# Patient Record
Sex: Female | Born: 1995
Health system: Southern US, Community
[De-identification: ages and names within clinical notes are randomized; demographics above are authoritative.]

## PROBLEM LIST (undated history)

## (undated) DIAGNOSIS — L709 Acne, unspecified: Secondary | ICD-10-CM

## (undated) DIAGNOSIS — M21069 Valgus deformity, not elsewhere classified, unspecified knee: Secondary | ICD-10-CM

## (undated) HISTORY — PX: WISDOM TOOTH EXTRACTION: SHX21

## (undated) HISTORY — PX: TYMPANOSTOMY TUBE PLACEMENT: SHX32

## (undated) HISTORY — DX: Acne, unspecified: L70.9

## (undated) HISTORY — DX: Valgus deformity, not elsewhere classified, unspecified knee: M21.069

---
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
## 2014-07-02 ENCOUNTER — Ambulatory Visit: Payer: BC Managed Care – PPO | Admitting: Family Medicine

## 2015-01-01 ENCOUNTER — Ambulatory Visit (INDEPENDENT_AMBULATORY_CARE_PROVIDER_SITE_OTHER): Payer: Federal, State, Local not specified - PPO | Admitting: Family Medicine

## 2015-01-01 ENCOUNTER — Encounter: Payer: Self-pay | Admitting: Family Medicine

## 2015-01-01 VITALS — BP 100/58 | HR 85 | Temp 98.1°F | Ht 66.5 in | Wt 125.4 lb

## 2015-01-01 DIAGNOSIS — L700 Acne vulgaris: Secondary | ICD-10-CM | POA: Diagnosis not present

## 2015-01-01 DIAGNOSIS — Z7189 Other specified counseling: Secondary | ICD-10-CM | POA: Diagnosis not present

## 2015-01-01 DIAGNOSIS — Z7689 Persons encountering health services in other specified circumstances: Secondary | ICD-10-CM

## 2015-01-01 DIAGNOSIS — L709 Acne, unspecified: Secondary | ICD-10-CM

## 2015-01-01 NOTE — Patient Instructions (Signed)
Physical with pap in 12/2014  We recommend the following healthy lifestyle measures: - eat a healthy diet consisting of lots of vegetables, fruits, beans, nuts, seeds, healthy meats such as white chicken and fish and whole grains.  - avoid fried foods, fast food, processed foods, sodas, red meet and other fattening foods.  - get a least 150 minutes of aerobic exercise per week.

## 2015-01-01 NOTE — Progress Notes (Signed)
HPI:  Brittany Shepard is here to establish care.  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 Se[facility] her [facility] - on acutane and just had labs done which she brought today. Very mild trig elevation. On OCPs as well. Doing well. Uses abstinence. She sees Arlana LindauJulie Fisher at [facility].   ROS negative for unless reported above: fevers, unintentional weight loss, hearing or vision loss, chest pain, palpitations, struggling to breath, hemoptysis, melena, hematochezia, hematuria, falls, loc, si, thoughts of self harm  Past Medical History  Diagnosis Date  . Acne   . GENU VALGUM 05/26/2010    Qualifier: Diagnosis of  By: Jennette KettleNeal MD, Huntley DecSara      Past Surgical History  Procedure Laterality Date  . Wisdom tooth extraction    . Tympanostomy tube placement      Family History  Problem Relation Age of Onset  . Arthritis Maternal Grandmother   . Diabetes Paternal Grandfather   . Lung cancer Paternal Grandfather   . Kidney cancer Maternal Grandmother   . Hypertension Paternal Grandfather   . Hypertension Paternal Grandmother     History   Social History  . Marital Status: Single    Spouse Name: N/A  . Number of Children: N/A  . Years of Education: N/A   Social History Main Topics  . Smoking status: Never Smoker   . Smokeless tobacco: Not on file  . Alcohol Use: Not on file     Comment: occ alcohol, a few drinks once to twice per month  . Drug Use: No  . Sexual Activity: No   Other Topics Concern  . None   Social History Narrative   Work or School: [employer], nanny during the summer      Home Situation: living at home during the summer, roommates during the school year      Spiritual Beliefs: Christian      Lifestyle: regular exercise, runner - about 5 miles per week, works out too; healthy diet           Current outpatient prescriptions:  .  ISOtretinoin (ACCUTANE) 40 MG capsule, Take 80 mg by mouth daily., Disp: , Rfl:   .  norethindrone-ethinyl estradiol 1/35 (NORTREL 1/35, 21,) tablet, Take 1 tablet by mouth daily., Disp: , Rfl:   EXAM:  Filed Vitals:   01/01/15 1116  BP: 100/58  Pulse: 85  Temp: 98.1 F (36.7 C)    Body mass index is 19.94 kg/(m^2).  GENERAL: vitals reviewed and listed above, alert, oriented, appears well hydrated and in no acute distress  HEENT: atraumatic, conjunttiva clear, no obvious abnormalities on inspection of external nose and ears  NECK: no obvious masses on inspection  LUNGS: clear to auscultation bilaterally, no wheezes, rales or rhonchi, good air movement  CV: HRRR, no peripheral edema  MS: moves all extremities without noticeable abnormality  PSYCH: pleasant and cooperative, no obvious depression or anxiety  ASSESSMENT AND PLAN:  Discussed the following assessment and plan:  Encounter to establish care  Acne, unspecified acne type  Acne vulgaris  -We reviewed the PMH, PSH, FH, SH, Meds and Allergies. -We provided refills for any medications we will prescribe as needed. -We addressed current concerns per orders and patient instructions. -We have asked for records for pertinent exams, studies, vaccines and notes from previous providers. -We have advised patient to follow up per instructions below.   -Patient advised to return or notify a doctor immediately  if symptoms worsen or persist or new concerns arise.  Patient Instructions  Physical with pap in 12/2014  We recommend the following healthy lifestyle measures: - eat a healthy diet consisting of lots of vegetables, fruits, beans, nuts, seeds, healthy meats such as white chicken and fish and whole grains.  - avoid fried foods, fast food, processed foods, sodas, red meet and other fattening foods.  - get a least 150 minutes of aerobic exercise per week.       Kriste Basque R.

## 2015-01-01 NOTE — Progress Notes (Signed)
Pre visit review using our clinic review tool, if applicable. No additional management support is needed unless otherwise documented below in the visit note. 

## 2016-01-07 ENCOUNTER — Encounter: Payer: Self-pay | Admitting: Family Medicine

## 2016-01-07 ENCOUNTER — Ambulatory Visit (INDEPENDENT_AMBULATORY_CARE_PROVIDER_SITE_OTHER): Payer: Federal, State, Local not specified - PPO | Admitting: Family Medicine

## 2016-01-07 ENCOUNTER — Other Ambulatory Visit (HOSPITAL_COMMUNITY)
Admission: RE | Admit: 2016-01-07 | Discharge: 2016-01-07 | Disposition: A | Discharge status: Home / Self Care | Payer: Federal, State, Local not specified - PPO | Source: Ambulatory Visit | Attending: Family Medicine | Admitting: Family Medicine

## 2016-01-07 VITALS — BP 100/58 | HR 95 | Temp 99.0°F | Ht 67.25 in | Wt 134.6 lb

## 2016-01-07 DIAGNOSIS — Z0000 Encounter for general adult medical examination without abnormal findings: Secondary | ICD-10-CM

## 2016-01-07 DIAGNOSIS — Z01419 Encounter for gynecological examination (general) (routine) without abnormal findings: Secondary | ICD-10-CM | POA: Insufficient documentation

## 2016-01-07 DIAGNOSIS — Z124 Encounter for screening for malignant neoplasm of cervix: Secondary | ICD-10-CM

## 2016-01-07 NOTE — Addendum Note (Signed)
Addended by: Johnella MoloneyFUNDERBURK, JO A on: 01/07/2016 09:12 AM   Modules accepted: Orders

## 2016-01-07 NOTE — Patient Instructions (Signed)
BEFORE YOU LEAVE: -follow up: physical in 1 year  We have ordered a pap smear at this visit. It can take up to 1-2 weeks for results and processing. IF results require follow up or explanation, we will call you with instructions. Clinically stable results will be released to your [facility]. If you have not heard from us or cannot find your results in [facility] in 2 weeks please contact our office at [phone].  If you are not yet signed up for [facility], please consider signing up.  We recommend the following healthy lifestyle: 1) Eat a healthy clean diet with avoidance of (less then 1 serving per week) processed foods, sweetened drinks, white starches, red meat, fast foods and sweets and consisting of: * 5-9 servings per day of fresh or frozen fruits and vegetables (not corn or potatoes, not dried or canned) *nuts and seeds, beans *olives and olive oil *small portions of lean meats such as fish and white chicken  *small portions of whole grains -Get at least 150 minutes of sweaty aerobic exercise per week -reduce stress - counseling, meditation, relaxation to balance other aspects of your life

## 2016-01-07 NOTE — Progress Notes (Signed)
Pre visit review using our clinic review tool, if applicable. No additional management support is needed unless otherwise documented below in the visit note. 

## 2016-01-07 NOTE — Progress Notes (Signed)
HPI:  Here for CPE:  -Concerns and/or follow up today: none. Spent a month in [place] this summer. 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  -Diet: variety of foods, balance and well rounded, l  -Exercise: regular exercise  -Taking folic acid, vitamin D or calcium: no  -Diabetes and Dyslipidemia Screening: n/a  -Hx of HTN: no  -Vaccines: UTD  -pap history: never  -FDLMP: 12/09/15  -sexual activity: in the past  -wants STI testing (Hep C if born [birth year]): no  -FH breast, colon or ovarian ca: see FH Last mammogram: n/a Last colon cancer screening: n/a  Breast Ca Risk Assessment:  -Alcohol, Tobacco, drug use: see social history  Review of Systems - no fevers, unintentional weight loss, vision loss, hearing loss, chest pain, sob, hemoptysis, melena, hematochezia, hematuria, genital discharge, changing or concerning skin lesions, bleeding, bruising, loc, thoughts of self harm or SI  Past Medical History  Diagnosis Date  . Acne   . GENU VALGUM 05/26/2010    Qualifier: Diagnosis of  By: Jennette Kettle MD, Huntley Dec      Past Surgical History  Procedure Laterality Date  . Wisdom tooth extraction    . Tympanostomy tube placement      Family History  Problem Relation Age of Onset  . Arthritis Maternal Grandmother   . Diabetes Paternal Grandfather   . Lung cancer Paternal Grandfather   . Kidney cancer Maternal Grandmother   . Hypertension Paternal Grandfather   . Hypertension Paternal Grandmother     Social History   Social History  . Marital Status: Single    Spouse Name: N/A  . Number of Children: N/A  . Years of Education: N/A   Social History Main Topics  . Smoking status: Never Smoker   . Smokeless tobacco: None  . Alcohol Use: None     Comment: occ alcohol, a few drinks once to twice per month  . Drug Use: No  . Sexual Activity: No   Other Topics Concern  . 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  norethindrone-ethinyl estradiol 1/35 (NORTREL 1/35, 21,) tablet, Take 1 tablet by mouth daily., Disp: , Rfl:   EXAM:  Filed Vitals:   01/07/16 0825  BP: 100/58  Pulse: 95  Temp: 99 F (37.2 C)    GENERAL: vitals reviewed and listed below, alert, oriented, appears well hydrated and in no acute distress  HEENT: head atraumatic, PERRLA, normal appearance of eyes, ears, nose and mouth. moist mucus membranes.  NECK: supple, no masses or lymphadenopathy  LUNGS: clear to auscultation bilaterally, no rales, rhonchi or wheeze  CV: HRRR, no peripheral edema or cyanosis, normal pedal pulses  BREAST: normal appearance - no lesions or discharge, on palpation normal breast tissue without any suspicious masses  ABDOMEN: bowel sounds normal, soft, non tender to palpation, no masses, no rebound or guarding  GU: normal appearance of external genitalia - no lesions or masses, normal vaginal mucosa - no abnormal discharge, normal appearance of cervix - no lesions or abnormal discharge, no masses or tenderness on palpation of uterus  and ovaries.  RECTAL: refused  SKIN: no rash or abnormal lesions  MS: normal gait, moves all extremities normally  NEURO: normal gait, speech and thought processing grossly intact, muscle tone grossly intact throughout  PSYCH: normal affect, pleasant and cooperative  ASSESSMENT AND PLAN:  Discussed the following assessment and plan:  Visit for preventive health examination  Cervical cancer screening  -Discussed and advised all [place] preventive services health task force level A and B recommendations for age, sex and risks.  -Advised at least 150 minutes of exercise per week and a  healthy diet with avoidance of (less then 1 serving per week) processed foods, white starches, red meat, fast foods and sweets and consisting of: * 5-9 servings of fresh fruits and vegetables (not corn or potatoes) *nuts and seeds, beans *olives and olive oil *lean meats such as fish and white chicken  *whole grains  -labs, studies and vaccines per orders this encounter  No orders of the defined types were placed in this encounter.    Patient advised to return to clinic immediately if symptoms worsen or persist or new concerns.  Patient Instructions  BEFORE YOU LEAVE: -follow up: physical in 1 year  We have ordered a pap smear at this visit. It can take up to 1-2 weeks for results and processing. IF results require follow up or explanation, we will call you with instructions. Clinically stable results will be released to your [facility]. If you have not heard from us or cannot find your results in [facility] in 2 weeks please contact our office at [phone].  If you are not yet signed up for [facility], please consider signing up.  We recommend the following healthy lifestyle: 1) Eat a healthy clean diet with avoidance of (less then 1 serving per week) processed foods, sweetened drinks, white starches, red meat, fast foods and sweets and consisting of: * 5-9 servings per day of fresh or frozen fruits and vegetables (not corn or potatoes, not dried or canned) *nuts and seeds, beans *olives and olive oil *small portions of lean meats such as fish and white chicken  *small portions of whole grains -Get at least 150 minutes of sweaty aerobic exercise per week -reduce stress - counseling, meditation, relaxation to balance other aspects of your life             No Follow-up on file.  Kriste BasqueKIM, HANNAH R., DO

## 2016-01-08 LAB — CYTOLOGY - PAP

## 2016-01-17 ENCOUNTER — Ambulatory Visit (INDEPENDENT_AMBULATORY_CARE_PROVIDER_SITE_OTHER): Payer: Federal, State, Local not specified - PPO | Admitting: Family Medicine

## 2016-01-17 ENCOUNTER — Other Ambulatory Visit (HOSPITAL_COMMUNITY)
Admission: RE | Admit: 2016-01-17 | Discharge: 2016-01-17 | Disposition: A | Discharge status: Home / Self Care | Payer: Federal, State, Local not specified - PPO | Source: Ambulatory Visit | Attending: Family Medicine | Admitting: Family Medicine

## 2016-01-17 ENCOUNTER — Encounter: Payer: Self-pay | Admitting: Family Medicine

## 2016-01-17 VITALS — BP 120/68 | HR 105 | Temp 98.9°F | Ht 67.25 in | Wt 131.7 lb

## 2016-01-17 DIAGNOSIS — N898 Other specified noninflammatory disorders of vagina: Secondary | ICD-10-CM | POA: Diagnosis not present

## 2016-01-17 DIAGNOSIS — Z113 Encounter for screening for infections with a predominantly sexual mode of transmission: Secondary | ICD-10-CM | POA: Insufficient documentation

## 2016-01-17 DIAGNOSIS — N760 Acute vaginitis: Secondary | ICD-10-CM | POA: Diagnosis not present

## 2016-01-17 NOTE — Progress Notes (Signed)
Pre visit review using our clinic review tool, if applicable. No additional management support is needed unless otherwise documented below in the visit note. 

## 2016-01-17 NOTE — Patient Instructions (Signed)
Make sure we have a good number to reach you on.

## 2016-01-17 NOTE — Progress Notes (Signed)
HPI:  Brittany Shepard is an anxious and pleasant 20 yo here for vaginitis. Has had one sexual encounter once and did not use protection. Partner is a very good friend and is not known to have STI or high risk behaviors. However, she has had a slight vaginal discharge and had some vulvovag pruritis 4 days ago. Denies fevers, NV, pelvic or abdominal pain, foul odor, dysuria or any other symptoms. Just had period and was on time and normal. Declined upreg. Just had normal pap.   ROS: See pertinent positives and negatives per HPI.  Past Medical History  Diagnosis Date  . Acne   . GENU VALGUM 05/26/2010    Qualifier: Diagnosis of  By: Jennette KettleNeal MD, Huntley DecSara      Past Surgical History  Procedure Laterality Date  . Wisdom tooth extraction    . Tympanostomy tube placement      Family History  Problem Relation Age of Onset  . Arthritis Maternal Grandmother   . Diabetes Paternal Grandfather   . Lung cancer Paternal Grandfather   . Kidney cancer Maternal Grandmother   . Hypertension Paternal Grandfather   . Hypertension Paternal Grandmother     Social History   Social History  . Marital Status: Single    Spouse Name: N/A  . Number of Children: N/A  . Years of Education: N/A   Social History Main Topics  . Smoking status: Never Smoker   . Smokeless tobacco: None  . Alcohol Use: None     Comment: occ alcohol, a few drinks once to twice per month  . Drug Use: No  . Sexual Activity: No   Other Topics Concern  . 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  loratadine (CLARITIN) 10 MG tablet, Take 10 mg by mouth daily., Disp: , Rfl:  .  norethindrone-ethinyl  estradiol 1/35 (NORTREL 1/35, 21,) tablet, Take 1 tablet by mouth daily., Disp: , Rfl:   EXAM:  Filed Vitals:   01/17/16 0939  BP: 120/68  Pulse: 105  Temp: 98.9 F (37.2 C)    Body mass index is 20.48 kg/(m^2).  GENERAL: vitals reviewed and listed above, alert, oriented, appears well hydrated and in no acute distress  HEENT: atraumatic, conjunttiva clear, no obvious abnormalities on inspection of external nose and ears  NECK: no obvious masses on inspection  GU: normal inspection external genitalia, white homogenous vaginal discharge, no CMT  MS: moves all extremities without noticeable abnormality  PSYCH: pleasant and cooperative, no obvious depression or anxiety  ASSESSMENT AND PLAN:  Discussed the following assessment and plan:  Vaginal discharge - Plan: Cervicovaginal ancillary only  -GC/Chlam/Trich/BV/yeast testing pending -advised to use condoms for all encounters -offer tx today for GC/Chlam out of abundance of caution given age and unprotected sex, but suspect BV - she opted to hold off after discussion risks/benefits and treat only if testing positive -Patient advised to return or notify a doctor immediately if symptoms worsen or persist or new concerns arise.  Patient Instructions  Make sure we have a good number to reach you on.  Colin Benton R., DO

## 2016-01-20 LAB — CERVICOVAGINAL ANCILLARY ONLY
Chlamydia: NEGATIVE
NEISSERIA GONORRHEA: NEGATIVE
Trichomonas: NEGATIVE

## 2016-01-21 ENCOUNTER — Encounter: Payer: Self-pay | Admitting: Family Medicine

## 2016-01-22 LAB — CERVICOVAGINAL ANCILLARY ONLY
Bacterial vaginitis: POSITIVE — AB
Candida vaginitis: NEGATIVE

## 2016-01-23 MED ORDER — METRONIDAZOLE 500 MG PO TABS: 500.0000 mg | ORAL_TABLET | Freq: Two times a day (BID) | ORAL | 0 refills | Status: DC

## 2016-01-23 NOTE — Addendum Note (Signed)
Addended by: Johnella Moloney on: 01/23/2016 07:32 AM   Modules accepted: Orders

## 2016-01-28 ENCOUNTER — Encounter: Payer: Self-pay | Admitting: Family Medicine

## 2016-04-03 ENCOUNTER — Telehealth: Payer: Self-pay | Admitting: Family Medicine

## 2016-04-03 MED ORDER — NORETHINDRONE-ETH ESTRADIOL 0.5-35 MG-MCG PO TABS: 1.0000 | ORAL_TABLET | Freq: Every day | ORAL | 11 refills | Status: DC

## 2016-04-03 NOTE — Telephone Encounter (Signed)
Patients mother is asking that we refill norethindrone-ethinyl estradiol 1/35 (NORTREL 1/35, 21,) tablet  [facility] [address]. We did the patients pap, and the patient has not been back to her obgyn since that time. She is asking that we change the RX to .5/38/28 rather than the script on file. She states that the patient goes off of it for 3 days only and it has made a positive difference.

## 2016-04-03 NOTE — Addendum Note (Signed)
Addended by: Johnella MoloneyFUNDERBURK, JO A on: 04/03/2016 02:02 PM   Modules accepted: Orders

## 2016-04-03 NOTE — Telephone Encounter (Signed)
I called the pt and she stated she has not missed any pills at all.  The correct Rx for Notrel was sent to the pts pharmacy and she is aware of this.

## 2016-04-03 NOTE — Telephone Encounter (Signed)
Brittany CollumJo Anne, Please call pt (not mother). Please ensure daily use without missing pills. If missed doses, advise upreg, then can refill current ocp for 1 year. If not missed doses ok to refill.Thanks.

## 2016-10-23 DIAGNOSIS — Z20828 Contact with and (suspected) exposure to other viral communicable diseases: Secondary | ICD-10-CM | POA: Diagnosis not present

## 2016-11-08 DIAGNOSIS — Z20828 Contact with and (suspected) exposure to other viral communicable diseases: Secondary | ICD-10-CM | POA: Diagnosis not present

## 2016-11-08 DIAGNOSIS — J029 Acute pharyngitis, unspecified: Secondary | ICD-10-CM | POA: Diagnosis not present

## 2016-12-15 ENCOUNTER — Other Ambulatory Visit: Payer: Self-pay | Admitting: Family Medicine

## 2016-12-15 NOTE — Telephone Encounter (Signed)
Patient is scheduled to see you for a CPE on 01/22/17 at 11.30, Pt is requesting to have a refill on NORTREL 0.5 MG. Please Advise.

## 2017-01-08 ENCOUNTER — Encounter: Payer: Federal, State, Local not specified - PPO | Admitting: Family Medicine

## 2017-01-21 NOTE — Progress Notes (Signed)
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 health nut  -Exercise:  regular exercise  -Taking folic acid, vitamin D or calcium: taking vit D  -Diabetes and Dyslipidemia Screening: n/a  -Hx of HTN: no  -Vaccines: UTD  -pap history: normal 12/2015  -FDLMP: monthly, regular, normal  -sexual activity: no partners, wants to wait for the right person, continues OCPs  -wants STI testing (Hep C if born 45-65): agrees to offered HIV screening, declines others  -FH breast, colon or ovarian ca: see FH Last mammogram: n/a Last colon cancer screening: n/a  -Alcohol, Tobacco, drug use: see social history  Review of Systems - no fevers, unintentional weight loss, vision loss, hearing loss, chest pain, sob, hemoptysis, melena, hematochezia, hematuria, genital discharge, changing or concerning skin lesions, bleeding, bruising, loc, thoughts of self harm or SI  Past Medical History:  Diagnosis Date  . Acne   . GENU VALGUM 05/26/2010   Qualifier: Diagnosis of  By: Nori Riis MD, Clarise Cruz      Past Surgical History:  Procedure Laterality Date  . TYMPANOSTOMY TUBE PLACEMENT    . WISDOM TOOTH EXTRACTION      Family History  Problem Relation Age of Onset  . Arthritis Maternal Grandmother   . Diabetes Paternal Grandfather   . Lung cancer Paternal Grandfather   . Kidney cancer Maternal Grandmother   . Hypertension Paternal Grandfather   . Hypertension Paternal Grandmother     Social History   Social History  . Marital status: Single    Spouse name: N/A  . Number of children: N/A  . Years of education: N/A   Social History Main Topics  . Smoking status: Never Smoker  . Smokeless tobacco: Never Used  . Alcohol use None     Comment: occ alcohol, a few drinks once to twice per month  . Drug use: No  . Sexual activity: No   Other Topics Concern  . None   Social History Narrative   Work or School:  [employer], nanny during the summer      Home Situation: living at home during the summer, roommates during the school year      Spiritual Beliefs: Christian      Lifestyle: regular exercise, runner - about 5 miles per week, works out too; healthy diet           Current Outpatient Prescriptions:  .  loratadine (CLARITIN) 10 MG tablet, Take 10 mg by mouth daily., Disp: , Rfl:  .  NORTREL 0.5/35, 28, 0.5-35 MG-MCG tablet, TAKE 1 TABLET BY MOUTH DAILY., Disp: 28 tablet, Rfl: 3  EXAM:  Vitals:   01/22/17 1123  BP: 108/70  Pulse: 88  Temp: 99 F (37.2 C)    GENERAL: vitals reviewed and listed below, alert, oriented, appears well hydrated and in no acute distress  HEENT: head atraumatic, PERRLA, normal appearance of eyes, ears, nose and mouth. moist mucus membranes.  NECK: supple, no masses or lymphadenopathy  LUNGS: clear to auscultation bilaterally, no rales, rhonchi or wheeze  CV: HRRR, no peripheral edema or cyanosis, normal pedal pulses  ABDOMEN: bowel sounds normal, soft, non tender to palpation, no masses, no rebound or guarding  GU/BREAST: declined  SKIN: no rash or abnormal lesions  MS: normal gait, moves all extremities normally  NEURO: normal gait, speech and thought processing grossly intact, muscle tone grossly intact throughout  PSYCH: normal affect,  pleasant and cooperative  ASSESSMENT AND PLAN:  Discussed the following assessment and plan:  Encounter for preventive health examination - Plan: HIV antibody  Encounter for surveillance of contraceptive pills  -Discussed and advised all [place] preventive services health task force level A and B recommendations for age, sex and risks.  -Advised at least 150 minutes of exercise per week and a healthy diet with avoidance of (less then 1 serving per week) processed foods, white starches, red meat, fast foods and sweets and consisting of: * 5-9 servings of fresh fruits and vegetables (not corn or  potatoes) *nuts and seeds, beans *olives and olive oil *lean meats such as fish and white chicken  *whole grains  -labs, studies and vaccines per orders this encounter  Orders Placed This Encounter  Procedures  . HIV antibody    Patient advised to return to clinic immediately if symptoms worsen or persist or new concerns.  Patient Instructions  BEFORE YOU LEAVE: -lab -follow up: yearly for CPE  We have ordered labs or studies at this visit. It can take up to 1-2 weeks for results and processing. IF results require follow up or explanation, we will call you with instructions. Clinically stable results will be released to your [facility]. If you have not heard from [place] or cannot find your results in [facility] in 2 weeks please contact our office at [phone].  If you are not yet signed up for [facility], please consider signing up.  Health Maintenance, Female Adopting a healthy lifestyle and getting preventive care can go a long way to promote health and wellness. Talk with your health care provider about what schedule of regular examinations is right for you. This is a good chance for you to check in with your provider about disease prevention and staying healthy. In between checkups, there are plenty of things you can do on your own. Experts have done a lot of research about which lifestyle changes and preventive measures are most likely to keep you healthy. Ask your health care provider for more information. Weight and diet Eat a healthy diet  Be sure to include plenty of vegetables, fruits, low-fat dairy products, and lean protein.  Do not eat a lot of foods high in solid fats, added sugars, or salt.  Get regular exercise. This is one of the most important things you can do for your health. ? Most adults should exercise for at least 150 minutes each week. The exercise should increase your heart rate and make you sweat (moderate-intensity exercise). ? Most adults should also do  strengthening exercises at least twice a week. This is in addition to the moderate-intensity exercise.  Maintain a healthy weight  Body mass index (BMI) is a measurement that can be used to identify possible weight problems. It estimates body fat based on height and weight. Your health care provider can help determine your BMI and help you achieve or maintain a healthy weight.  For females 9 years of age and older: ? A BMI below 18.5 is considered underweight. ? A BMI of 18.5 to 24.9 is normal. ? A BMI of 25 to 29.9 is considered overweight. ? A BMI of 30 and above is considered obese.  Watch levels of cholesterol and blood lipids  You should start having your blood tested for lipids and cholesterol at 22 years of age, then have this test every 5 years.  You may need to have your cholesterol levels checked more often if: ? Your lipid or cholesterol levels  are high. ? You are older than 22 years of age. ? You are at high risk for heart disease.  Cancer screening Lung Cancer  Lung cancer screening is recommended for adults 57-55 years old who are at high risk for lung cancer because of a history of smoking.  A yearly low-dose CT scan of the lungs is recommended for people who: ? Currently smoke. ? Have quit within the past 15 years. ? Have at least a 30-pack-year history of smoking. A pack year is smoking an average of one pack of cigarettes a day for 1 year.  Yearly screening should continue until it has been 15 years since you quit.  Yearly screening should stop if you develop a health problem that would prevent you from having lung cancer treatment.  Breast Cancer  Practice breast self-awareness. This means understanding how your breasts normally appear and feel.  It also means doing regular breast self-exams. Let your health care provider know about any changes, no matter how small.  If you are in your 20s or 30s, you should have a clinical breast exam (CBE) by a health  care provider every 1-3 years as part of a regular health exam.  If you are 40 or older, have a CBE every year. Also consider having a breast X-ray (mammogram) every year.  If you have a family history of breast cancer, talk to your health care provider about genetic screening.  If you are at high risk for breast cancer, talk to your health care provider about having an MRI and a mammogram every year.  Breast cancer gene (BRCA) assessment is recommended for women who have family members with BRCA-related cancers. BRCA-related cancers include: ? Breast. ? Ovarian. ? Tubal. ? Peritoneal cancers.  Results of the assessment will determine the need for genetic counseling and BRCA1 and BRCA2 testing.  Cervical Cancer Your health care provider may recommend that you be screened regularly for cancer of the pelvic organs (ovaries, uterus, and vagina). This screening involves a pelvic examination, including checking for microscopic changes to the surface of your cervix (Pap test). You may be encouraged to have this screening done every 3 years, beginning at age 22.  For women ages 67-65, health care providers may recommend pelvic exams and Pap testing every 3 years, or they may recommend the Pap and pelvic exam, combined with testing for human papilloma virus (HPV), every 5 years. Some types of HPV increase your risk of cervical cancer. Testing for HPV may also be done on women of any age with unclear Pap test results.  Other health care providers may not recommend any screening for nonpregnant women who are considered low risk for pelvic cancer and who do not have symptoms. Ask your health care provider if a screening pelvic exam is right for you.  If you have had past treatment for cervical cancer or a condition that could lead to cancer, you need Pap tests and screening for cancer for at least 20 years after your treatment. If Pap tests have been discontinued, your risk factors (such as having a new  sexual partner) need to be reassessed to determine if screening should resume. Some women have medical problems that increase the chance of getting cervical cancer. In these cases, your health care provider may recommend more frequent screening and Pap tests.  Colorectal Cancer  This type of cancer can be detected and often prevented.  Routine colorectal cancer screening usually begins at 22 years of age and continues through  22 years of age.  Your health care provider may recommend screening at an earlier age if you have risk factors for colon cancer.  Your health care provider may also recommend using home test kits to check for hidden blood in the stool.  A small camera at the end of a tube can be used to examine your colon directly (sigmoidoscopy or colonoscopy). This is done to check for the earliest forms of colorectal cancer.  Routine screening usually begins at age 33.  Direct examination of the colon should be repeated every 5-10 years through 22 years of age. However, you may need to be screened more often if early forms of precancerous polyps or small growths are found.  Skin Cancer  Check your skin from head to toe regularly.  Tell your health care provider about any new moles or changes in moles, especially if there is a change in a mole's shape or color.  Also tell your health care provider if you have a mole that is larger than the size of a pencil eraser.  Always use sunscreen. Apply sunscreen liberally and repeatedly throughout the day.  Protect yourself by wearing long sleeves, pants, a wide-brimmed hat, and sunglasses whenever you are outside.  Heart disease, diabetes, and high blood pressure  High blood pressure causes heart disease and increases the risk of stroke. High blood pressure is more likely to develop in: ? People who have blood pressure in the high end of the normal range (130-139/85-89 mm Hg). ? People who are overweight or obese. ? People who are  African American.  If you are 100-3 years of age, have your blood pressure checked every 3-5 years. If you are 60 years of age or older, have your blood pressure checked every year. You should have your blood pressure measured twice-once when you are at a hospital or clinic, and once when you are not at a hospital or clinic. Record the average of the two measurements. To check your blood pressure when you are not at a hospital or clinic, you can use: ? An automated blood pressure machine at a pharmacy. ? A home blood pressure monitor.  If you are between 92 years and 69 years old, ask your health care provider if you should take aspirin to prevent strokes.  Have regular diabetes screenings. This involves taking a blood sample to check your fasting blood sugar level. ? If you are at a normal weight and have a low risk for diabetes, have this test once every three years after 22 years of age. ? If you are overweight and have a high risk for diabetes, consider being tested at a younger age or more often. Preventing infection Hepatitis B  If you have a higher risk for hepatitis B, you should be screened for this virus. You are considered at high risk for hepatitis B if: ? You were born in a country where hepatitis B is common. Ask your health care provider which countries are considered high risk. ? Your parents were born in a high-risk country, and you have not been immunized against hepatitis B (hepatitis B vaccine). ? You have HIV or AIDS. ? You use needles to inject street drugs. ? You live with someone who has hepatitis B. ? You have had sex with someone who has hepatitis B. ? You get hemodialysis treatment. ? You take certain medicines for conditions, including cancer, organ transplantation, and autoimmune conditions.  Hepatitis C  Blood testing is recommended for: ? Everyone  born from [birth year] through 1966. ? Anyone with known risk factors for hepatitis C.  Sexually transmitted  infections (STIs)  You should be screened for sexually transmitted infections (STIs) including gonorrhea and chlamydia if: ? You are sexually active and are younger than 22 years of age. ? You are older than 22 years of age and your health care provider tells you that you are at risk for this type of infection. ? Your sexual activity has changed since you were last screened and you are at an increased risk for chlamydia or gonorrhea. Ask your health care provider if you are at risk.  If you do not have HIV, but are at risk, it may be recommended that you take a prescription medicine daily to prevent HIV infection. This is called pre-exposure prophylaxis (PrEP). You are considered at risk if: ? You are sexually active and do not regularly use condoms or know the HIV status of your partner(s). ? You take drugs by injection. ? You are sexually active with a partner who has HIV.  Talk with your health care provider about whether you are at high risk of being infected with HIV. If you choose to begin PrEP, you should first be tested for HIV. You should then be tested every 3 months for as long as you are taking PrEP. Pregnancy  If you are premenopausal and you may become pregnant, ask your health care provider about preconception counseling.  If you may become pregnant, take 400 to 800 micrograms (mcg) of folic acid every day.  If you want to prevent pregnancy, talk to your health care provider about birth control (contraception). Osteoporosis and menopause  Osteoporosis is a disease in which the bones lose minerals and strength with aging. This can result in serious bone fractures. Your risk for osteoporosis can be identified using a bone density scan.  If you are 89 years of age or older, or if you are at risk for osteoporosis and fractures, ask your health care provider if you should be screened.  Ask your health care provider whether you should take a calcium or vitamin D supplement to lower  your risk for osteoporosis.  Menopause may have certain physical symptoms and risks.  Hormone replacement therapy may reduce some of these symptoms and risks. Talk to your health care provider about whether hormone replacement therapy is right for you. Follow these instructions at home:  Schedule regular health, dental, and eye exams.  Stay current with your immunizations.  Do not use any tobacco products including cigarettes, chewing tobacco, or electronic cigarettes.  If you are pregnant, do not drink alcohol.  If you are breastfeeding, limit how much and how often you drink alcohol.  Limit alcohol intake to no more than 1 drink per day for nonpregnant women. One drink equals 12 ounces of beer, 5 ounces of wine, or 1 ounces of hard liquor.  Do not use street drugs.  Do not share needles.  Ask your health care provider for help if you need support or information about quitting drugs.  Tell your health care provider if you often feel depressed.  Tell your health care provider if you have ever been abused or do not feel safe at home. This information is not intended to replace advice given to you by your health care provider. Make sure you discuss any questions you have with your health care provider. Document Released: 12/29/2010 Document Revised: 11/21/2015 Document Reviewed: 03/19/2015 Elsevier Interactive Patient Education  [employer].  No Follow-up on file.  Colin Benton R., DO

## 2017-01-22 ENCOUNTER — Encounter: Payer: Self-pay | Admitting: Family Medicine

## 2017-01-22 ENCOUNTER — Ambulatory Visit (INDEPENDENT_AMBULATORY_CARE_PROVIDER_SITE_OTHER): Payer: Federal, State, Local not specified - PPO | Admitting: Family Medicine

## 2017-01-22 VITALS — BP 108/70 | HR 88 | Temp 99.0°F | Ht 67.0 in | Wt 128.4 lb

## 2017-01-22 DIAGNOSIS — Z3041 Encounter for surveillance of contraceptive pills: Secondary | ICD-10-CM

## 2017-01-22 DIAGNOSIS — Z0000 Encounter for general adult medical examination without abnormal findings: Secondary | ICD-10-CM | POA: Diagnosis not present

## 2017-01-22 NOTE — Patient Instructions (Signed)
BEFORE YOU LEAVE: -lab -follow up: yearly for CPE  We have ordered labs or studies at this visit. It can take up to 1-2 weeks for results and processing. IF results require follow up or explanation, we will call you with instructions. Clinically stable results will be released to your [facility]. If you have not heard from [place] or cannot find your results in [facility] in 2 weeks please contact our office at [phone].  If you are not yet signed up for [facility], please consider signing up.  Health Maintenance, Female Adopting a healthy lifestyle and getting preventive care can go a long way to promote health and wellness. Talk with your health care provider about what schedule of regular examinations is right for you. This is a good chance for you to check in with your provider about disease prevention and staying healthy. In between checkups, there are plenty of things you can do on your own. Experts have done a lot of research about which lifestyle changes and preventive measures are most likely to keep you healthy. Ask your health care provider for more information. Weight and diet Eat a healthy diet  Be sure to include plenty of vegetables, fruits, low-fat dairy products, and lean protein.  Do not eat a lot of foods high in solid fats, added sugars, or salt.  Get regular exercise. This is one of the most important things you can do for your health. ? Most adults should exercise for at least 150 minutes each week. The exercise should increase your heart rate and make you sweat (moderate-intensity exercise). ? Most adults should also do strengthening exercises at least twice a week. This is in addition to the moderate-intensity exercise.  Maintain a healthy weight  Body mass index (BMI) is a measurement that can be used to identify possible weight problems. It estimates body fat based on height and weight. Your health care provider can help determine your BMI and help you achieve or maintain a  healthy weight.  For females 36 years of age and older: ? A BMI below 18.5 is considered underweight. ? A BMI of 18.5 to 24.9 is normal. ? A BMI of 25 to 29.9 is considered overweight. ? A BMI of 30 and above is considered obese.  Watch levels of cholesterol and blood lipids  You should start having your blood tested for lipids and cholesterol at 22 years of age, then have this test every 5 years.  You may need to have your cholesterol levels checked more often if: ? Your lipid or cholesterol levels are high. ? You are older than 22 years of age. ? You are at high risk for heart disease.  Cancer screening Lung Cancer  Lung cancer screening is recommended for adults 61-65 years old who are at high risk for lung cancer because of a history of smoking.  A yearly low-dose CT scan of the lungs is recommended for people who: ? Currently smoke. ? Have quit within the past 15 years. ? Have at least a 30-pack-year history of smoking. A pack year is smoking an average of one pack of cigarettes a day for 1 year.  Yearly screening should continue until it has been 15 years since you quit.  Yearly screening should stop if you develop a health problem that would prevent you from having lung cancer treatment.  Breast Cancer  Practice breast self-awareness. This means understanding how your breasts normally appear and feel.  It also means doing regular breast self-exams. Let your  health care provider know about any changes, no matter how small.  If you are in your 20s or 30s, you should have a clinical breast exam (CBE) by a health care provider every 1-3 years as part of a regular health exam.  If you are 62 or older, have a CBE every year. Also consider having a breast X-ray (mammogram) every year.  If you have a family history of breast cancer, talk to your health care provider about genetic screening.  If you are at high risk for breast cancer, talk to your health care provider about  having an MRI and a mammogram every year.  Breast cancer gene (BRCA) assessment is recommended for women who have family members with BRCA-related cancers. BRCA-related cancers include: ? Breast. ? Ovarian. ? Tubal. ? Peritoneal cancers.  Results of the assessment will determine the need for genetic counseling and BRCA1 and BRCA2 testing.  Cervical Cancer Your health care provider may recommend that you be screened regularly for cancer of the pelvic organs (ovaries, uterus, and vagina). This screening involves a pelvic examination, including checking for microscopic changes to the surface of your cervix (Pap test). You may be encouraged to have this screening done every 3 years, beginning at age 48.  For women ages 57-65, health care providers may recommend pelvic exams and Pap testing every 3 years, or they may recommend the Pap and pelvic exam, combined with testing for human papilloma virus (HPV), every 5 years. Some types of HPV increase your risk of cervical cancer. Testing for HPV may also be done on women of any age with unclear Pap test results.  Other health care providers may not recommend any screening for nonpregnant women who are considered low risk for pelvic cancer and who do not have symptoms. Ask your health care provider if a screening pelvic exam is right for you.  If you have had past treatment for cervical cancer or a condition that could lead to cancer, you need Pap tests and screening for cancer for at least 20 years after your treatment. If Pap tests have been discontinued, your risk factors (such as having a new sexual partner) need to be reassessed to determine if screening should resume. Some women have medical problems that increase the chance of getting cervical cancer. In these cases, your health care provider may recommend more frequent screening and Pap tests.  Colorectal Cancer  This type of cancer can be detected and often prevented.  Routine colorectal cancer  screening usually begins at 22 years of age and continues through 22 years of age.  Your health care provider may recommend screening at an earlier age if you have risk factors for colon cancer.  Your health care provider may also recommend using home test kits to check for hidden blood in the stool.  A small camera at the end of a tube can be used to examine your colon directly (sigmoidoscopy or colonoscopy). This is done to check for the earliest forms of colorectal cancer.  Routine screening usually begins at age 42.  Direct examination of the colon should be repeated every 5-10 years through 22 years of age. However, you may need to be screened more often if early forms of precancerous polyps or small growths are found.  Skin Cancer  Check your skin from head to toe regularly.  Tell your health care provider about any new moles or changes in moles, especially if there is a change in a mole's shape or color.  Also  tell your health care provider if you have a mole that is larger than the size of a pencil eraser.  Always use sunscreen. Apply sunscreen liberally and repeatedly throughout the day.  Protect yourself by wearing long sleeves, pants, a wide-brimmed hat, and sunglasses whenever you are outside.  Heart disease, diabetes, and high blood pressure  High blood pressure causes heart disease and increases the risk of stroke. High blood pressure is more likely to develop in: ? People who have blood pressure in the high end of the normal range (130-139/85-89 mm Hg). ? People who are overweight or obese. ? People who are African American.  If you are 11-75 years of age, have your blood pressure checked every 3-5 years. If you are 5 years of age or older, have your blood pressure checked every year. You should have your blood pressure measured twice-once when you are at a hospital or clinic, and once when you are not at a hospital or clinic. Record the average of the two measurements.  To check your blood pressure when you are not at a hospital or clinic, you can use: ? An automated blood pressure machine at a pharmacy. ? A home blood pressure monitor.  If you are between 58 years and 44 years old, ask your health care provider if you should take aspirin to prevent strokes.  Have regular diabetes screenings. This involves taking a blood sample to check your fasting blood sugar level. ? If you are at a normal weight and have a low risk for diabetes, have this test once every three years after 22 years of age. ? If you are overweight and have a high risk for diabetes, consider being tested at a younger age or more often. Preventing infection Hepatitis B  If you have a higher risk for hepatitis B, you should be screened for this virus. You are considered at high risk for hepatitis B if: ? You were born in a country where hepatitis B is common. Ask your health care provider which countries are considered high risk. ? Your parents were born in a high-risk country, and you have not been immunized against hepatitis B (hepatitis B vaccine). ? You have HIV or AIDS. ? You use needles to inject street drugs. ? You live with someone who has hepatitis B. ? You have had sex with someone who has hepatitis B. ? You get hemodialysis treatment. ? You take certain medicines for conditions, including cancer, organ transplantation, and autoimmune conditions.  Hepatitis C  Blood testing is recommended for: ? Everyone born from [birth year] through 1966. ? Anyone with known risk factors for hepatitis C.  Sexually transmitted infections (STIs)  You should be screened for sexually transmitted infections (STIs) including gonorrhea and chlamydia if: ? You are sexually active and are younger than 22 years of age. ? You are older than 22 years of age and your health care provider tells you that you are at risk for this type of infection. ? Your sexual activity has changed since you were last screened  and you are at an increased risk for chlamydia or gonorrhea. Ask your health care provider if you are at risk.  If you do not have HIV, but are at risk, it may be recommended that you take a prescription medicine daily to prevent HIV infection. This is called pre-exposure prophylaxis (PrEP). You are considered at risk if: ? You are sexually active and do not regularly use condoms or know the HIV status of your  partner(s). ? You take drugs by injection. ? You are sexually active with a partner who has HIV.  Talk with your health care provider about whether you are at high risk of being infected with HIV. If you choose to begin PrEP, you should first be tested for HIV. You should then be tested every 3 months for as long as you are taking PrEP. Pregnancy  If you are premenopausal and you may become pregnant, ask your health care provider about preconception counseling.  If you may become pregnant, take 400 to 800 micrograms (mcg) of folic acid every day.  If you want to prevent pregnancy, talk to your health care provider about birth control (contraception). Osteoporosis and menopause  Osteoporosis is a disease in which the bones lose minerals and strength with aging. This can result in serious bone fractures. Your risk for osteoporosis can be identified using a bone density scan.  If you are 23 years of age or older, or if you are at risk for osteoporosis and fractures, ask your health care provider if you should be screened.  Ask your health care provider whether you should take a calcium or vitamin D supplement to lower your risk for osteoporosis.  Menopause may have certain physical symptoms and risks.  Hormone replacement therapy may reduce some of these symptoms and risks. Talk to your health care provider about whether hormone replacement therapy is right for you. Follow these instructions at home:  Schedule regular health, dental, and eye exams.  Stay current with your  immunizations.  Do not use any tobacco products including cigarettes, chewing tobacco, or electronic cigarettes.  If you are pregnant, do not drink alcohol.  If you are breastfeeding, limit how much and how often you drink alcohol.  Limit alcohol intake to no more than 1 drink per day for nonpregnant women. One drink equals 12 ounces of beer, 5 ounces of wine, or 1 ounces of hard liquor.  Do not use street drugs.  Do not share needles.  Ask your health care provider for help if you need support or information about quitting drugs.  Tell your health care provider if you often feel depressed.  Tell your health care provider if you have ever been abused or do not feel safe at home. This information is not intended to replace advice given to you by your health care provider. Make sure you discuss any questions you have with your health care provider. Document Released: 12/29/2010 Document Revised: 11/21/2015 Document Reviewed: 03/19/2015 Elsevier Interactive Patient Education  [employer].

## 2017-01-23 LAB — HIV ANTIBODY (ROUTINE TESTING W REFLEX): HIV: NONREACTIVE

## 2017-03-18 ENCOUNTER — Encounter: Payer: Self-pay | Admitting: Family Medicine

## 2017-03-31 ENCOUNTER — Other Ambulatory Visit: Payer: Self-pay | Admitting: Family Medicine

## 2017-07-08 ENCOUNTER — Encounter: Payer: Self-pay | Admitting: Family Medicine

## 2017-09-16 ENCOUNTER — Ambulatory Visit: Payer: Self-pay | Admitting: Family Medicine

## 2018-01-17 NOTE — Progress Notes (Addendum)
HPI:  Using dictation device. Unfortunately this device frequently misinterprets words/phrases.  Here for CPE:  -Concerns and/or follow up today:  Rarely has mildly increased vaginal discharge. No sig odor. No irr bleeding, no pain, dysuria or STI exposure. Wants refill on ocp rx print out as has moved to [place] and unsure of pharmacy she will use.  -Diet: variety of foods, balance and well rounded, larger portion sizes -Exercise: no regular exercise -Taking folic acid, vitamin D or calcium: no -Diabetes and Dyslipidemia Screening: n/a -Vaccines: see vaccine section EPIC -pap history: 7/17 normal -FDLMP: see nursing notes -sexual activity: yes, female partner -wants STI testing (Hep C if born 1946-65):yes -FH breast, colon or ovarian ca: see FH Last mammogram: n/a Last colon cancer screening: n/a Breast Ca Risk Assessment: see family history and pt history DEXA (>/= 21): n/a  -Alcohol, Tobacco, drug use: see social history  Review of Systems - no fevers, unintentional weight loss, vision loss, hearing loss, chest pain, sob, hemoptysis, melena, hematochezia, hematuria, changing or concerning skin lesions, bleeding, bruising, loc, thoughts of self harm or SI  Past Medical History:  Diagnosis Date  . Acne   . GENU VALGUM 05/26/2010   Qualifier: Diagnosis of  By: Nori Riis MD, Clarise Cruz      Past Surgical History:  Procedure Laterality Date  . TYMPANOSTOMY TUBE PLACEMENT    . WISDOM TOOTH EXTRACTION      Family History  Problem Relation Age of Onset  . Arthritis Maternal Grandmother   . Diabetes Paternal Grandfather   . Lung cancer Paternal Grandfather   . Kidney cancer Maternal Grandmother   . Hypertension Paternal Grandfather   . Hypertension Paternal Grandmother     Social History   Socioeconomic History  . Marital status: Single    Spouse name: Not on file  . Number of children: Not on file  . Years of education: Not on file  . Highest education level: Not on file    Occupational History  . Not on file  Social Needs  . Financial resource strain: Not on file  . Food insecurity:    Worry: Not on file    Inability: Not on file  . Transportation needs:    Medical: Not on file    Non-medical: Not on file  Tobacco Use  . Smoking status: Never Smoker  . Smokeless tobacco: Never Used  Substance and Sexual Activity  . Alcohol use: Not on file    Comment: occ alcohol, a few drinks once to twice per month  . Drug use: No  . Sexual activity: Never    Birth control/protection: Pill  Lifestyle  . Physical activity:    Days per week: Not on file    Minutes per session: Not on file  . Stress: Not on file  Relationships  . Social connections:    Talks on phone: Not on file    Gets together: Not on file    Attends religious service: Not on file    Active member of club or organization: Not on file    Attends meetings of clubs or organizations: Not on file    Relationship status: Not on file  Other Topics Concern  . Not on file  Social History Narrative   Work or School: [employer], nanny during the summer      Home Situation: living at home during the summer, roommates during the school year      Spiritual Beliefs: Christian      Lifestyle:  regular exercise, runner - about 5 miles per week, works out too; healthy diet        Current Outpatient Medications:  .  loratadine (CLARITIN) 10 MG tablet, Take 10 mg by mouth daily., Disp: , Rfl:  .  norethindrone-ethinyl estradiol (NORTREL 0.5/35, 28,) 0.5-35 MG-MCG tablet, Take 1 tablet by mouth daily., Disp: 84 tablet, Rfl: 3  EXAM:  Vitals:   01/18/18 1448  BP: 100/60  Pulse: (!) 105  Temp: 98.9 F (37.2 C)    GENERAL: vitals reviewed and listed below, alert, oriented, appears well hydrated and in no acute distress  HEENT: head atraumatic, PERRLA, normal appearance of eyes, ears, nose and mouth. moist mucus membranes.  NECK: supple, no masses or lymphadenopathy  LUNGS: clear to  auscultation bilaterally, no rales, rhonchi or wheeze  CV: HRRR, no peripheral edema or cyanosis, normal pedal pulses  ABDOMEN: bowel sounds normal, soft, non tender to palpation, no masses, no rebound or guarding  BREAST: normal appearance - no skin lesions or discharge noted on inspection of both breasts, on palpation of both breast and axillary region no suspicious lesions appreciated today  GU: normal appearance of external genitalia - no lesions or masses appreciated, normal appearing vaginal mucosa - no abnormal discharge, normal appearance of cervix - no lesions or abnormal discharge observed  RECTAL: deferred  SKIN: no rash or abnormal lesions  MS: normal gait, moves all extremities normally  NEURO: normal gait, speech and thought processing grossly intact, muscle tone grossly intact throughout  PSYCH: normal affect, pleasant and cooperative  ASSESSMENT AND PLAN:  Discussed the following assessment and plan:  PREVENTIVE EXAM: -Discussed and advised all [place] preventive services health task force level A and B recommendations for age, sex and risks. -Advised at least 150 minutes of exercise per week and a healthy diet with avoidance of (less then 1 serving per week) processed foods, white starches, red meat, fast foods and sweets and consisting of: * 5-9 servings of fresh fruits and vegetables (not corn or potatoes) *nuts and seeds, beans *olives and olive oil *lean meats such as fish and white chicken  *whole grains -labs, studies and vaccines per orders this encounter  1. Screening for depression -neg  STI screening/contraception: -refills provided -GC/Chlam, trich, BV, yeast, hiv, rpr pending   Patient advised to return to clinic immediately if symptoms worsen or persist or new concerns.  Patient Instructions  BEFORE YOU LEAVE: -lab -follow up: yearly for cpe  We have ordered labs or studies at this visit. It can take up to 1-2 weeks for results and processing.  IF results require follow up or explanation, we will call you with instructions. Clinically stable results will be released to your [facility]. If you have not heard from [place] or cannot find your results in [facility] in 2 weeks please contact our office at [phone].  If you are not yet signed up for [facility], please consider signing up.   Preventive Care 18-39 Years, Female Preventive care refers to lifestyle choices and visits with your health care provider that can promote health and wellness. What does preventive care include?  A yearly physical exam. This is also called an annual well check.  Dental exams once or twice a year.  Routine eye exams. Ask your health care provider how often you should have your eyes checked.  Personal lifestyle choices, including: ? Daily care of your teeth and gums. ? Regular physical activity. ? Eating a healthy diet. ? Avoiding tobacco and drug use. ?  Limiting alcohol use. ? Practicing safe sex. ? Taking vitamin and mineral supplements as recommended by your health care provider. What happens during an annual well check? The services and screenings done by your health care provider during your annual well check will depend on your age, overall health, lifestyle risk factors, and family history of disease. Counseling Your health care provider may ask you questions about your:  Alcohol use.  Tobacco use.  Drug use.  Emotional well-being.  Home and relationship well-being.  Sexual activity.  Eating habits.  Work and work Statistician.  Method of birth control.  Menstrual cycle.  Pregnancy history.  Screening You may have the following tests or measurements:  Height, weight, and BMI.  Diabetes screening. This is done by checking your blood sugar (glucose) after you have not eaten for a while (fasting).  Blood pressure.  Lipid and cholesterol levels. These may be checked every 5 years starting at age 38.  Skin check.  Hepatitis C  blood test.  Hepatitis B blood test.  Sexually transmitted disease (STD) testing.  BRCA-related cancer screening. This may be done if you have a family history of breast, ovarian, tubal, or peritoneal cancers.  Pelvic exam and Pap test. This may be done every 3 years starting at age 48. Starting at age 40, this may be done every 5 years if you have a Pap test in combination with an HPV test.  Discuss your test results, treatment options, and if necessary, the need for more tests with your health care provider. Vaccines Your health care provider may recommend certain vaccines, such as:  Influenza vaccine. This is recommended every year.  Tetanus, diphtheria, and acellular pertussis (Tdap, Td) vaccine. You may need a Td booster every 10 years.  Varicella vaccine. You may need this if you have not been vaccinated.  HPV vaccine. If you are 46 or younger, you may need three doses over 6 months.  Measles, mumps, and rubella (MMR) vaccine. You may need at least one dose of MMR. You may also need a second dose.  Pneumococcal 13-valent conjugate (PCV13) vaccine. You may need this if you have certain conditions and were not previously vaccinated.  Pneumococcal polysaccharide (PPSV23) vaccine. You may need one or two doses if you smoke cigarettes or if you have certain conditions.  Meningococcal vaccine. One dose is recommended if you are age 38-21 years and a first-year college student living in a residence hall, or if you have one of several medical conditions. You may also need additional booster doses.  Hepatitis A vaccine. You may need this if you have certain conditions or if you travel or work in places where you may be exposed to hepatitis A.  Hepatitis B vaccine. You may need this if you have certain conditions or if you travel or work in places where you may be exposed to hepatitis B.  Haemophilus influenzae type b (Hib) vaccine. You may need this if you have certain risk  factors.  Talk to your health care provider about which screenings and vaccines you need and how often you need them. This information is not intended to replace advice given to you by your health care provider. Make sure you discuss any questions you have with your health care provider. Document Released: 08/11/2001 Document Revised: 03/04/2016 Document Reviewed: 04/16/2015 Elsevier Interactive Patient Education  2018 [employer].           No follow-ups on file.  Lucretia Kern, DO

## 2018-01-18 ENCOUNTER — Other Ambulatory Visit (HOSPITAL_COMMUNITY)
Admission: RE | Admit: 2018-01-18 | Discharge: 2018-01-18 | Disposition: A | Discharge status: Home / Self Care | Payer: Federal, State, Local not specified - PPO | Source: Ambulatory Visit | Attending: Family Medicine | Admitting: Family Medicine

## 2018-01-18 ENCOUNTER — Ambulatory Visit (INDEPENDENT_AMBULATORY_CARE_PROVIDER_SITE_OTHER): Payer: Federal, State, Local not specified - PPO | Admitting: Family Medicine

## 2018-01-18 ENCOUNTER — Other Ambulatory Visit: Payer: Self-pay | Admitting: Family Medicine

## 2018-01-18 ENCOUNTER — Encounter: Payer: Self-pay | Admitting: Family Medicine

## 2018-01-18 VITALS — BP 100/60 | HR 105 | Temp 98.9°F | Ht 67.0 in | Wt 124.4 lb

## 2018-01-18 DIAGNOSIS — N898 Other specified noninflammatory disorders of vagina: Secondary | ICD-10-CM

## 2018-01-18 DIAGNOSIS — Z0000 Encounter for general adult medical examination without abnormal findings: Secondary | ICD-10-CM | POA: Diagnosis not present

## 2018-01-18 DIAGNOSIS — Z1331 Encounter for screening for depression: Secondary | ICD-10-CM | POA: Diagnosis not present

## 2018-01-18 DIAGNOSIS — Z789 Other specified health status: Secondary | ICD-10-CM | POA: Diagnosis not present

## 2018-01-18 DIAGNOSIS — IMO0001 Reserved for inherently not codable concepts without codable children: Secondary | ICD-10-CM

## 2018-01-18 MED ORDER — NORETHINDRONE-ETH ESTRADIOL 0.5-35 MG-MCG PO TABS: 1.0000 | ORAL_TABLET | Freq: Every day | ORAL | 3 refills | Status: DC

## 2018-01-18 NOTE — Patient Instructions (Signed)
BEFORE YOU LEAVE: -lab -follow up: yearly for cpe  We have ordered labs or studies at this visit. It can take up to 1-2 weeks for results and processing. IF results require follow up or explanation, we will call you with instructions. Clinically stable results will be released to your [facility]. If you have not heard from [place] or cannot find your results in [facility] in 2 weeks please contact our office at [phone].  If you are not yet signed up for [facility], please consider signing up.   Preventive Care 23-39 Years, Female Preventive care refers to lifestyle choices and visits with your health care provider that can promote health and wellness. What does preventive care include?  A yearly physical exam. This is also called an annual well check.  Dental exams once or twice a year.  Routine eye exams. Ask your health care provider how often you should have your eyes checked.  Personal lifestyle choices, including: ? Daily care of your teeth and gums. ? Regular physical activity. ? Eating a healthy diet. ? Avoiding tobacco and drug use. ? Limiting alcohol use. ? Practicing safe sex. ? Taking vitamin and mineral supplements as recommended by your health care provider. What happens during an annual well check? The services and screenings done by your health care provider during your annual well check will depend on your age 23, overall health, lifestyle risk factors, and family history of disease. Counseling Your health care provider may ask you questions about your:  Alcohol use.  Tobacco use.  Drug use.  Emotional well-being.  Home and relationship well-being.  Sexual activity.  Eating habits.  Work and work Statistician.  Method of birth control.  Menstrual cycle.  Pregnancy history.  Screening You may have the following tests or measurements:  Height, weight, and BMI.  Diabetes screening. This is done by checking your blood sugar (glucose) after you have not eaten  for a while (fasting).  Blood pressure.  Lipid and cholesterol levels. These may be checked every 5 years starting at age 54.  Skin check.  Hepatitis C blood test.  Hepatitis B blood test.  Sexually transmitted disease (STD) testing.  BRCA-related cancer screening. This may be done if you have a family history of breast, ovarian, tubal, or peritoneal cancers.  Pelvic exam and Pap test. This may be done every 3 years starting at age 62. Starting at age 27, this may be done every 5 years if you have a Pap test in combination with an HPV test.  Discuss your test results, treatment options, and if necessary, the need for more tests with your health care provider. Vaccines Your health care provider may recommend certain vaccines, such as:  Influenza vaccine. This is recommended every year.  Tetanus, diphtheria, and acellular pertussis (Tdap, Td) vaccine. You may need a Td booster every 10 years.  Varicella vaccine. You may need this if you have not been vaccinated.  HPV vaccine. If you are 56 or younger, you may need three doses over 6 months.  Measles, mumps, and rubella (MMR) vaccine. You may need at least one dose of MMR. You may also need a second dose.  Pneumococcal 13-valent conjugate (PCV13) vaccine. You may need this if you have certain conditions and were not previously vaccinated.  Pneumococcal polysaccharide (PPSV23) vaccine. You may need one or two doses if you smoke cigarettes or if you have certain conditions.  Meningococcal vaccine. One dose is recommended if you are age 23-21 years and a Market researcher  living in a residence hall, or if you have one of several medical conditions. You may also need additional booster doses.  Hepatitis A vaccine. You may need this if you have certain conditions or if you travel or work in places where you may be exposed to hepatitis A.  Hepatitis B vaccine. You may need this if you have certain conditions or if you travel  or work in places where you may be exposed to hepatitis B.  Haemophilus influenzae type b (Hib) vaccine. You may need this if you have certain risk factors.  Talk to your health care provider about which screenings and vaccines you need and how often you need them. This information is not intended to replace advice given to you by your health care provider. Make sure you discuss any questions you have with your health care provider. Document Released: 08/11/2001 Document Revised: 03/04/2016 Document Reviewed: 04/16/2015 Elsevier Interactive Patient Education  [employer].

## 2018-01-18 NOTE — Addendum Note (Signed)
Addended by: Johnella MoloneyFUNDERBURK, Matalyn Nawaz A on: 01/18/2018 03:49 PM   Modules accepted: Orders

## 2018-01-19 LAB — HIV ANTIBODY (ROUTINE TESTING W REFLEX): HIV 1&2 Ab, 4th Generation: NONREACTIVE

## 2018-01-19 LAB — RPR: RPR Ser Ql: NONREACTIVE

## 2018-01-20 LAB — CERVICOVAGINAL ANCILLARY ONLY
BACTERIAL VAGINITIS: NEGATIVE
Candida vaginitis: NEGATIVE
Chlamydia: NEGATIVE
Neisseria Gonorrhea: NEGATIVE
Trichomonas: NEGATIVE

## 2018-01-25 ENCOUNTER — Encounter: Payer: Federal, State, Local not specified - PPO | Admitting: Family Medicine

## 2018-02-01 ENCOUNTER — Encounter: Payer: Federal, State, Local not specified - PPO | Admitting: Family Medicine

## 2018-06-13 ENCOUNTER — Other Ambulatory Visit: Payer: Self-pay | Admitting: Family Medicine

## 2018-06-14 ENCOUNTER — Encounter: Payer: Self-pay | Admitting: Family Medicine

## 2018-06-14 NOTE — Telephone Encounter (Signed)
Dr. Selena BattenKim patient. Thanks!

## 2018-07-18 ENCOUNTER — Ambulatory Visit: Payer: Federal, State, Local not specified - PPO | Admitting: Family Medicine

## 2018-07-18 ENCOUNTER — Encounter: Payer: Self-pay | Admitting: Family Medicine

## 2018-07-18 VITALS — BP 98/64 | HR 50 | Temp 98.6°F | Ht 67.0 in | Wt 119.7 lb

## 2018-07-18 DIAGNOSIS — N6002 Solitary cyst of left breast: Secondary | ICD-10-CM | POA: Diagnosis not present

## 2018-07-18 NOTE — Progress Notes (Signed)
  Brittany Shepard DOB: [birth year] Encounter date: 07/18/2018  This is a 23 y.o. female who presents with Chief Complaint  Patient presents with  . Breast Problem    patient states she noticed a lump in the left breast x1 week ago, pain only to the touch    History of present illness:  Noticed incidentally a little lump left breast. Has a lipoma of stomach; feels somewhat similar in breast. Moves around easily. Doesn't hurt unless she presses on it. Noted a day or two after period. 4 days passed since she noted; much harder to find now. Not tender, mobile. Just wanted to get it checked and make sure that it wasn't something to be worried about.     No Known Allergies Current Meds  Medication Sig  . loratadine (CLARITIN) 10 MG tablet Take 10 mg by mouth daily.  Marland Kitchen NORTREL 0.5/35, 28, 0.5-35 MG-MCG tablet TAKE 1 TABLET BY MOUTH EVERY DAY    Review of Systems  Constitutional: Negative for chills and fever.  Cardiovascular: Negative for chest pain.  Skin:       See HPI: lump left breast; less than 1 week. More difficult to find now. ? Smaller? Non tender    Objective:  BP 98/64 (BP Location: Right Arm, Patient Position: Sitting, Cuff Size: Normal)   Pulse (!) 50   Temp 98.6 F (37 C) (Oral)   Ht 5\' 7"  (1.702 m)   Wt 119 lb 11.2 oz (54.3 kg)   BMI 18.75 kg/m   Weight: 119 lb 11.2 oz (54.3 kg)   BP Readings from Last 3 Encounters:  07/18/18 98/64  01/18/18 100/60  01/22/17 108/70   Wt Readings from Last 3 Encounters:  07/18/18 119 lb 11.2 oz (54.3 kg)  01/18/18 124 lb 6.4 oz (56.4 kg)  01/22/17 128 lb 6.4 oz (58.2 kg)    Physical Exam Constitutional:      Appearance: Normal appearance.  HENT:     Head: Normocephalic and atraumatic.  Pulmonary:     Effort: Pulmonary effort is normal.  Chest:     Breasts: Breasts are symmetrical.        Right: No swelling, bleeding, inverted nipple, mass, nipple discharge, skin change or tenderness.        Left: Mass (there is  0.5cm mobile nodule left breast approx 5 o clock position) present. No swelling, bleeding, inverted nipple, nipple discharge, skin change or tenderness.     Comments: Breast nodule is best felt with patient in seated position; difficult to palpate in supine position. Lymphadenopathy:     Upper Body:     Right upper body: No supraclavicular, axillary or pectoral adenopathy.     Left upper body: No supraclavicular, axillary or pectoral adenopathy.  Neurological:     Mental Status: She is alert.     Assessment/Plan 1. Breast cyst, left Suspect cyst; advised not to palpate for a couple of weeks and if still present, ok to complete US for evaluation. If enlarging or change in meanwhile can complete sooner. If resolves before [place] does not need to complete.  - US BREAST LTD UNI LEFT INC AXILLA; Future Plan is for [place] if lesion not gone in 2 weeks time.   Return pending [place] results.    Theodis Shove, MD

## 2018-07-20 ENCOUNTER — Other Ambulatory Visit: Payer: Self-pay | Admitting: Family Medicine

## 2018-07-20 DIAGNOSIS — N6002 Solitary cyst of left breast: Secondary | ICD-10-CM

## 2018-08-15 ENCOUNTER — Ambulatory Visit: Payer: Self-pay | Admitting: *Deleted

## 2018-08-15 NOTE — Telephone Encounter (Signed)
Summary: spotting    Pt states that she had her period 2 weeks ago and she takes her pill every night at the same time, but today she is went to the bathroom and had some spotting. She states that her the spotting was a dark maroon color and she did insert a tampon. She states she is sexually active with her boyfriend. She states she has never had this before and wanted to discuss with the nurse.

## 2018-08-15 NOTE — Telephone Encounter (Signed)
Patient started very light spotting this afternoon- she is requesting an appointment this week to evaluate. Appointment scheduled Reason for Disposition . Using birth control pills  Answer Assessment - Initial Assessment Questions 1. AMOUNT: "Describe the bleeding that you are having."    - SPOTTING: spotting, or pinkish / brownish mucous discharge; does not fill panti-liner or pad    - MILD:  less than 1 pad / hour; less than patient's usual menstrual bleeding   - MODERATE: 1-2 pads / hour; 1 menstrual cup every 6 hours; small-medium blood clots (e.g., pea, grape, small coin)   - SEVERE: soaking 2 or more pads/hour for 2 or more hours; 1 menstrual cup every 2 hours; bleeding not contained by pads or continuous red blood from vagina; large blood clots (e.g., golf ball, large coin)      spotting 2. ONSET: "When did the bleeding begin?" "Is it continuing now?"     Around 1:10 today- does not think so 3. MENSTRUAL PERIOD: "When was the last normal menstrual period?" "How is this different than your period?"     2 weeks ago- normal cycle, very light spotting 4. REGULARITY: "How regular are your periods?"     Very- regular on the pill 5. ABDOMINAL PAIN: "Do you have any pain?" "How bad is the pain?"  (e.g., Scale 1-10; mild, moderate, or severe)   - MILD (1-3): doesn't interfere with normal activities, abdomen soft and not tender to touch    - MODERATE (4-7): interferes with normal activities or awakens from sleep, tender to touch    - SEVERE (8-10): excruciating pain, doubled over, unable to do any normal activities      No cramping- a little" tight" feeling- not any more 6. PREGNANCY: "Could you be pregnant?" "Are you sexually active?" "Did you recently give birth?"     No- yes- no 7. BREASTFEEDING: "Are you breastfeeding?"     no 8. HORMONES: "Are you taking any hormone medications, prescription or OTC?" (e.g., birth control pills, estrogen)     nortrel- on this pill for a few years 9.  BLOOD THINNERS: "Do you take any blood thinners?" (e.g., Coumadin/warfarin, Pradaxa/dabigatran, aspirin)     no 10. CAUSE: "What do you think is causing the bleeding?" (e.g., recent gyn surgery, recent gyn procedure; known bleeding disorder, cervical cancer, polycystic ovarian disease, fibroids)         No sure 11. HEMODYNAMIC STATUS: "Are you weak or feeling lightheaded?" If so, ask: "Can you stand and walk normally?"        no 12. OTHER SYMPTOMS: "What other symptoms are you having with the bleeding?" (e.g., passed tissue, vaginal discharge, fever, menstrual-type cramps)       no  Protocols used: VAGINAL BLEEDING - ABNORMAL-A-AH

## 2018-08-16 ENCOUNTER — Encounter: Payer: Self-pay | Admitting: Family Medicine

## 2018-08-16 ENCOUNTER — Ambulatory Visit: Payer: Self-pay

## 2018-08-16 NOTE — Telephone Encounter (Signed)
I called the pt and informed her of the message below.  Patient stated she will await the appt tomorrow here.

## 2018-08-16 NOTE — Telephone Encounter (Signed)
Would recommend to keep appointment and if heavy bleeding or pain or concerns that can not wait for appointment would recommend going to ucc today in [place]. However, it is fairly common to have irregular bleeding/spotting especially when starting or stopping new pills or if missed doses or not taking at the same time every day.

## 2018-08-16 NOTE — Telephone Encounter (Signed)
Pt called to report breakthrough bleeding 9 days before period is due. Pt is on BCP and is on her active pills. Pt asking if she should continue taking the active pills or begin the placebo pills.  Pt had spotting and last night bleeding was heavier but not as heavy as her normal menstruation. Pt have "very mild" menstrual cramping. No clots passed. Pt tearful and asked repeatedly if she needs to get blood work done in [place] today before visit tomorrow. Pt very concerned that she may be pregnant. Pt stated she took a pregnancy test yesterday and it was negative. Pt scheduled and rescheduled appt . Pt moved up appt to tomorrow and requested the latest appointment. Pt scheduled for 6:00 pm tomorrow with Dr Caryl Never. PCP did not have available appointments.  Please advise pt regarding blood work. Reason for Disposition . [1] Menstrual cycle < 21 days OR > 35 days AND [2] has occurred once this past year  Answer Assessment - Initial Assessment Questions 1. AMOUNT: "Describe the bleeding that you are having."    - SPOTTING: spotting, or pinkish / brownish mucous discharge; does not fill panti-liner or pad    - MILD:  less than 1 pad / hour; less than patient's usual menstrual bleeding   - MODERATE: 1-2 pads / hour; 1 menstrual cup every 6 hours; small-medium blood clots (e.g., pea, grape, small coin)   - SEVERE: soaking 2 or more pads/hour for 2 or more hours; 1 menstrual cup every 2 hours; bleeding not contained by pads or continuous red blood from vagina; large blood clots (e.g., golf ball, large coin)      mild 2. ONSET: "When did the bleeding begin?" "Is it continuing now?"    Last night 3. MENSTRUAL PERIOD: "When was the last normal menstrual period?" "How is this different than your period?"     1st week of February came 9 days early  4. REGULARITY: "How regular are your periods?"     Every month 5. ABDOMINAL PAIN: "Do you have any pain?" "How bad is the pain?"  (e.g., Scale 1-10; mild,  moderate, or severe)   - MILD (1-3): doesn't interfere with normal activities, abdomen soft and not tender to touch    - MODERATE (4-7): interferes with normal activities or awakens from sleep, tender to touch    - SEVERE (8-10): excruciating pain, doubled over, unable to do any normal activities      mild 6. PREGNANCY: "Could you be pregnant?" "Are you sexually active?" "Did you recently give birth?"     "I do not believe so"- took test yesterday and was negative- yes sexually active- did not recently given birth 7. BREASTFEEDING: "Are you breastfeeding?"     n/a 8. HORMONES: "Are you taking any hormone medications, prescription or OTC?" (e.g., birth control pills, estrogen)     BCP 9. BLOOD THINNERS: "Do you take any blood thinners?" (e.g., Coumadin/warfarin, Pradaxa/dabigatran, aspirin)     no 10. CAUSE: "What do you think is causing the bleeding?" (e.g., recent gyn surgery, recent gyn procedure; known bleeding disorder, cervical cancer, polycystic ovarian disease, fibroids)         Breakthrough bleeding 11. HEMODYNAMIC STATUS: "Are you weak or feeling lightheaded?" If so, ask: "Can you stand and walk normally?"        No-yes 12. OTHER SYMPTOMS: "What other symptoms are you having with the bleeding?" (e.g., passed tissue, vaginal discharge, fever, menstrual-type cramps)       Menstrual type cramps  Protocols  used: VAGINAL BLEEDING - ABNORMAL-A-AH

## 2018-08-16 NOTE — Telephone Encounter (Signed)
Patient has an appointment with you tomorrow

## 2018-08-17 ENCOUNTER — Encounter: Payer: Self-pay | Admitting: Family Medicine

## 2018-08-17 ENCOUNTER — Ambulatory Visit: Payer: Federal, State, Local not specified - PPO | Admitting: Family Medicine

## 2018-08-17 ENCOUNTER — Other Ambulatory Visit: Payer: Self-pay

## 2018-08-17 VITALS — BP 118/78 | HR 115 | Temp 98.7°F | Ht 67.0 in | Wt 119.3 lb

## 2018-08-17 DIAGNOSIS — N939 Abnormal uterine and vaginal bleeding, unspecified: Secondary | ICD-10-CM | POA: Diagnosis not present

## 2018-08-17 NOTE — Patient Instructions (Addendum)
Abnormal Uterine Bleeding Abnormal uterine bleeding is unusual bleeding from the uterus. It includes:  Bleeding or spotting between periods.  Bleeding after sex.  Bleeding that is heavier than normal.  Periods that last longer than usual.  Bleeding after menopause. Abnormal uterine bleeding can affect women at various stages in life, including teenagers, women in their reproductive years, pregnant women, and women who have reached menopause. Common causes of abnormal uterine bleeding include:  Pregnancy.  Growths of tissue (polyps).  A noncancerous tumor in the uterus (fibroid).  Infection.  Cancer.  Hormonal imbalances. Any type of abnormal bleeding should be evaluated by a health care provider. Many cases are minor and simple to treat, while others are more serious. Treatment will depend on the cause of the bleeding. Follow these instructions at home:  Monitor your condition for any changes.  Do not use tampons, douche, or have sex if told by your health care provider.  Change your pads often.  Get regular exams that include pelvic exams and cervical cancer screening.  Keep all follow-up visits as told by your health care provider. This is important. Contact a health care provider if:  Your bleeding lasts for more than one week.  You feel dizzy at times.  You feel nauseous or you vomit. Get help right away if:  You pass out.  Your bleeding soaks through a pad every hour.  You have abdominal pain.  You have a fever.  You become sweaty or weak.  You pass large blood clots from your vagina. Summary  Abnormal uterine bleeding is unusual bleeding from the uterus.  Any type of abnormal bleeding should be evaluated by a health care provider. Many cases are minor and simple to treat, while others are more serious.  Treatment will depend on the cause of the bleeding. This information is not intended to replace advice given to you by your health care provider.  Make sure you discuss any questions you have with your health care provider. Document Released: 06/15/2005 Document Revised: 07/17/2016 Document Reviewed: 07/17/2016 Elsevier Interactive Patient Education  2019 [employer].  Might consider trying some Aleve two twice daily short term for the spotting.

## 2018-08-17 NOTE — Progress Notes (Signed)
Subjective:     Patient ID: Brittany Shepard, female   DOB: December 11, 1995, 23 y.o.   MRN: [id number]  HPI Patient is seen with some vaginal spotting which started this past Monday (2 days ago).  She has been on the same oral contraceptive pill regimen for approximately 5 years.  She has been very compliant with taking her tablets.  She states the only thing that fell out of cycle was back just before her last cycle which started February 4 she states her "yellow pill" which is the last day of her regular pills before placebo fell out of her pocket and she ended up taking the placebo 1 day prior to schedule.  Other than that, she has been completely compliant with therapy.  She has same partner for past year and uses barrier contraception, though somewhat inconsistently.  She states this past Monday she had some very mild spotting and then by later that night she had flow equivalent to when she is on her period.  By Tuesday minimal spotting but then she had some recurrence again last night and then some mild spotting today.  Only very mild abdominal cramping.  Patient checked 2 different pregnancy tests including Monday and then again yesterday and they were both negative.  No history of STD.  No vaginal discharge.  Her menses are generally very regular.  She did have one somewhat similar episode of spotting outside of her usual cycle about a year ago.  That was very brief.  She is basically requesting serum pregnancy screen even though she knows the odds of this are very likely to be negative given two home negative urine tests.  She has not had any easy bruising or easy bleeding.  Generally feels well otherwise.  She exercises regularly.  No recent dizziness or headaches.  She is under tremendous job stress and realizes that may be contributing some to her irregularity as well.  Appetite and weight are stable.  Past Medical History:  Diagnosis Date  . Acne   . GENU VALGUM 05/26/2010   Qualifier:  Diagnosis of  By: Jennette Kettle MD, [place] Dec     Past Surgical History:  Procedure Laterality Date  . TYMPANOSTOMY TUBE PLACEMENT    . WISDOM TOOTH EXTRACTION      reports that she has never smoked. She has never used smokeless tobacco. She reports that she does not use drugs. No history on file for alcohol. family history includes Arthritis in her maternal grandmother; Diabetes in her paternal grandfather; Hypertension in her paternal grandfather and paternal grandmother; Kidney cancer in her maternal grandmother; Lung cancer in her paternal grandfather; Other in her mother. No Known Allergies   Review of Systems  Constitutional: Negative for appetite change and unexpected weight change.  Respiratory: Negative for shortness of breath.   Gastrointestinal: Negative for abdominal pain.  Genitourinary: Positive for vaginal bleeding. Negative for dysuria, genital sores, hematuria, pelvic pain and vaginal discharge.  Neurological: Negative for dizziness, weakness and headaches.       Objective:   Physical Exam Constitutional:      Appearance: Normal appearance.  Cardiovascular:     Rate and Rhythm: Normal rate and regular rhythm.  Pulmonary:     Effort: Pulmonary effort is normal.     Breath sounds: Normal breath sounds.  Genitourinary:    General: Normal vulva.     Vagina: No vaginal discharge.     Comments: Normal external genitalia.  Patient has some dark-colored blood in the vaginal vault.  This  is partly obscuring the cervical os.  No visible polyps.  No other lesions noted.  Other than some bleeding no vaginal discharge.  Bimanual exam reveals normal-sized uterus.  No cervical motion tenderness.  No adnexal tenderness.  No adnexal masses Neurological:     Mental Status: She is alert.        Assessment:     Patient presents with 3-day history of some abnormal (relatively mild) uterine bleeding on oral contraceptive pills.  She has had excellent compliance with use.  Low likelihood of  pregnancy- especially in view of 2 recent home negative pregnancy tests. Doubt infectious.    Plan:     -Lab had already gone for today so we put in future order for CBC.  Patient requesting serum pregnancy though we explained this will likely be negative. -we elected not to repeat urine pregnancy screen since home preg neg X 2.  -Would not recommend changing her regular contraception at this time.  Thus far her spotting has been very mild and infrequent.  -We gave her option of trying nonsteroidal short-term if she continues to have spotting and she will consider Aleve 2 twice daily -she is not interested in continuous oral contraceptive options or IUD at this time - she will discuss with Dr Selena Batten possible change in OCP dose in future if irregular spotting continues.  Kristian Covey MD [facility]

## 2018-08-18 ENCOUNTER — Telehealth: Payer: Self-pay | Admitting: Family Medicine

## 2018-08-18 ENCOUNTER — Ambulatory Visit
Admission: RE | Admit: 2018-08-18 | Discharge: 2018-08-18 | Disposition: A | Discharge status: Home / Self Care | Payer: Federal, State, Local not specified - PPO | Source: Ambulatory Visit | Attending: Family Medicine | Admitting: Family Medicine

## 2018-08-18 ENCOUNTER — Ambulatory Visit: Payer: Federal, State, Local not specified - PPO | Admitting: Family Medicine

## 2018-08-18 ENCOUNTER — Encounter: Payer: Self-pay | Admitting: Family Medicine

## 2018-08-18 ENCOUNTER — Other Ambulatory Visit: Payer: Federal, State, Local not specified - PPO

## 2018-08-18 DIAGNOSIS — N939 Abnormal uterine and vaginal bleeding, unspecified: Secondary | ICD-10-CM | POA: Diagnosis not present

## 2018-08-18 DIAGNOSIS — N6002 Solitary cyst of left breast: Secondary | ICD-10-CM

## 2018-08-18 DIAGNOSIS — N6323 Unspecified lump in the left breast, lower outer quadrant: Secondary | ICD-10-CM | POA: Diagnosis not present

## 2018-08-18 LAB — CBC WITH DIFFERENTIAL/PLATELET
BASOS PCT: 1.2 % (ref 0.0–3.0)
Basophils Absolute: 0.1 10*3/uL (ref 0.0–0.1)
Eosinophils Absolute: 0.1 10*3/uL (ref 0.0–0.7)
Eosinophils Relative: 2.1 % (ref 0.0–5.0)
HCT: 40.6 % (ref 36.0–46.0)
Hemoglobin: 13.6 g/dL (ref 12.0–15.0)
Lymphocytes Relative: 32 % (ref 12.0–46.0)
Lymphs Abs: 1.4 10*3/uL (ref 0.7–4.0)
MCHC: 33.5 g/dL (ref 30.0–36.0)
MCV: 89.9 fl (ref 78.0–100.0)
Monocytes Absolute: 0.3 10*3/uL (ref 0.1–1.0)
Monocytes Relative: 6 % (ref 3.0–12.0)
Neutro Abs: 2.6 10*3/uL (ref 1.4–7.7)
Neutrophils Relative %: 58.7 % (ref 43.0–77.0)
Platelets: 185 10*3/uL (ref 150.0–400.0)
RBC: 4.51 Mil/uL (ref 3.87–5.11)
RDW: 12.2 % (ref 11.5–15.5)
WBC: 4.4 10*3/uL (ref 4.0–10.5)

## 2018-08-18 NOTE — Telephone Encounter (Signed)
Pt given results per notes of Dr Hassan Rowan on 08/18/18.Unable to document in result note due to result note not being routed to [facility].  Pt is wanting all lab results when they become available. Pt is concerned. Also release to MyChart.

## 2018-08-18 NOTE — Telephone Encounter (Signed)
I have sent in her CBC results.  Serum pregnancy test pending.

## 2018-08-18 NOTE — Telephone Encounter (Signed)
I called the pt and informed her of the message below

## 2018-08-19 ENCOUNTER — Encounter: Payer: Self-pay | Admitting: Family Medicine

## 2018-08-19 LAB — HCG, SERUM, QUALITATIVE: PREG SERUM: NEGATIVE

## 2018-10-12 ENCOUNTER — Encounter: Payer: Self-pay | Admitting: Family Medicine

## 2018-10-13 ENCOUNTER — Other Ambulatory Visit: Payer: Self-pay

## 2018-10-13 ENCOUNTER — Ambulatory Visit (INDEPENDENT_AMBULATORY_CARE_PROVIDER_SITE_OTHER): Payer: Federal, State, Local not specified - PPO | Admitting: Family Medicine

## 2018-10-13 DIAGNOSIS — N939 Abnormal uterine and vaginal bleeding, unspecified: Secondary | ICD-10-CM

## 2018-10-13 NOTE — Progress Notes (Signed)
Patient ID: Brittany Shepard, female   DOB: [birth year], 23 y.o.   MRN: [id number]  Virtual Visit via Video Note  I connected with Brittany Shepard on 10/13/18 at  8:15 AM EDT by a video enabled telemedicine application and verified that I am speaking with the correct person using two identifiers.  Location patient: home Location provider:work or home office Persons participating in the virtual visit: patient, provider  I discussed the limitations of evaluation and management by telemedicine and the availability of in person appointments. The patient expressed understanding and agreed to proceed.   HPI: Patient was seen about 2 months ago with some mild breakthrough bleeding between her periods.  Her pregnancy screen was negative and CBC unremarkable.  She states back in March she had normal cycle but then about 3 days ago had some very light spotting and a little bit of dark discharge.  She is about 3 to 4 days prior to when her usual menstrual period starts.  She has been taking Nortrel now for about 5 years and very consistent with use.  Never skips.  Prior to that she was on a couple of other oral contraceptive pills but had weight gain or emotional changes.  She is not sure which those were.  She has not been interested in continuous contraception or IUD in the past.    She has taken nonsteroidals which seemed to help her spotting.  She did 2 recent home pregnancy test which were both negative.  No pelvic pain.  No fevers or chills.  No dizziness.  Is currently on Nortrel 0.5/35 and again is taking this very consistently.  She is sexually active.   ROS: See pertinent positives and negatives per HPI.  Past Medical History:  Diagnosis Date  . Acne   . GENU VALGUM 05/26/2010   Qualifier: Diagnosis of  By: Jennette KettleNeal MD, Huntley DecSara      Past Surgical History:  Procedure Laterality Date  . TYMPANOSTOMY TUBE PLACEMENT    . WISDOM TOOTH EXTRACTION      Family History  Problem Relation Age of  Onset  . Arthritis Maternal Grandmother   . Kidney cancer Maternal Grandmother   . Diabetes Paternal Grandfather   . Lung cancer Paternal Grandfather   . Hypertension Paternal Grandfather   . Hypertension Paternal Grandmother   . Other Mother        cyst of breast    SOCIAL HX: Non-smoker   Current Outpatient Medications:  .  loratadine (CLARITIN) 10 MG tablet, Take 10 mg by mouth daily., Disp: , Rfl:  .  NORTREL 0.5/35, 28, 0.5-35 MG-MCG tablet, TAKE 1 TABLET BY MOUTH EVERY DAY, Disp: 84 tablet, Rfl: 2  EXAM:  VITALS per patient if applicable:  GENERAL: alert, oriented, appears well and in no acute distress  HEENT: atraumatic, conjunttiva clear, no obvious abnormalities on inspection of external nose and ears  NECK: normal movements of the head and neck  LUNGS: on inspection no signs of respiratory distress, breathing rate appears normal, no obvious gross SOB, gasping or wheezing  CV: no obvious cyanosis  MS: moves all visible extremities without noticeable abnormality  PSYCH/NEURO: pleasant and cooperative, no obvious depression or anxiety, speech and thought processing grossly intact  ASSESSMENT AND PLAN:  Discussed the following assessment and plan:  Mild vaginal spotting prior to normal onset of menses.  Home pregnancy test negative x2.  -We reconfirmed that she has been very consistent with use of OCPs -We recommend as a first step taking Aleve  twice daily which has helped in the past -She will try to get names of prior OCPs that she has tried and had intolerance to -We discussed other options such as IUD or continuous oral contraception but she is not interested at this point -Would look at possible change of ratio of estrogen and progesterone and she will try to get prior OCP she has tried to [place] first     I discussed the assessment and treatment plan with the patient. The patient was provided an opportunity to ask questions and all were answered. The patient  agreed with the plan and demonstrated an understanding of the instructions.   The patient was advised to call back or seek an in-person evaluation if the symptoms worsen or if the condition fails to improve as anticipated.   Evelena Peat, MD

## 2018-10-21 ENCOUNTER — Encounter: Payer: Self-pay | Admitting: Family Medicine

## 2018-10-24 ENCOUNTER — Encounter: Payer: Self-pay | Admitting: Family Medicine

## 2018-10-24 MED ORDER — NORETHIN-ETH ESTRAD TRIPHASIC 0.5/0.75/1-35 MG-MCG PO TABS: 1.0000 | ORAL_TABLET | Freq: Every day | ORAL | 11 refills | Status: AC

## 2018-11-13 ENCOUNTER — Encounter: Payer: Self-pay | Admitting: Family Medicine

## 2018-12-23 ENCOUNTER — Encounter: Payer: Self-pay | Admitting: Family Medicine

## 2019-01-19 DIAGNOSIS — Z01419 Encounter for gynecological examination (general) (routine) without abnormal findings: Secondary | ICD-10-CM | POA: Diagnosis not present

## 2019-01-19 DIAGNOSIS — R87610 Atypical squamous cells of undetermined significance on cytologic smear of cervix (ASC-US): Secondary | ICD-10-CM | POA: Diagnosis not present

## 2019-01-19 DIAGNOSIS — Z681 Body mass index (BMI) 19 or less, adult: Secondary | ICD-10-CM | POA: Diagnosis not present

## 2019-01-19 DIAGNOSIS — N939 Abnormal uterine and vaginal bleeding, unspecified: Secondary | ICD-10-CM | POA: Diagnosis not present

## 2019-01-19 DIAGNOSIS — Z3041 Encounter for surveillance of contraceptive pills: Secondary | ICD-10-CM | POA: Diagnosis not present

## 2019-01-25 DIAGNOSIS — Z0000 Encounter for general adult medical examination without abnormal findings: Secondary | ICD-10-CM | POA: Diagnosis not present

## 2019-01-25 DIAGNOSIS — Z23 Encounter for immunization: Secondary | ICD-10-CM | POA: Diagnosis not present

## 2019-01-25 DIAGNOSIS — Z1389 Encounter for screening for other disorder: Secondary | ICD-10-CM | POA: Diagnosis not present

## 2019-01-26 DIAGNOSIS — Z0000 Encounter for general adult medical examination without abnormal findings: Secondary | ICD-10-CM | POA: Diagnosis not present

## 2019-02-09 DIAGNOSIS — N939 Abnormal uterine and vaginal bleeding, unspecified: Secondary | ICD-10-CM | POA: Diagnosis not present

## 2019-04-04 DIAGNOSIS — R7989 Other specified abnormal findings of blood chemistry: Secondary | ICD-10-CM | POA: Diagnosis not present

## 2019-05-13 DIAGNOSIS — Z20828 Contact with and (suspected) exposure to other viral communicable diseases: Secondary | ICD-10-CM | POA: Diagnosis not present

## 2019-05-24 DIAGNOSIS — U071 COVID-19: Secondary | ICD-10-CM | POA: Diagnosis not present

## 2019-05-31 DIAGNOSIS — Z20828 Contact with and (suspected) exposure to other viral communicable diseases: Secondary | ICD-10-CM | POA: Diagnosis not present

## 2019-06-01 DIAGNOSIS — F411 Generalized anxiety disorder: Secondary | ICD-10-CM | POA: Diagnosis not present

## 2019-06-02 DIAGNOSIS — Z20828 Contact with and (suspected) exposure to other viral communicable diseases: Secondary | ICD-10-CM | POA: Diagnosis not present

## 2019-06-08 DIAGNOSIS — F411 Generalized anxiety disorder: Secondary | ICD-10-CM | POA: Diagnosis not present

## 2019-07-19 DIAGNOSIS — R0600 Dyspnea, unspecified: Secondary | ICD-10-CM | POA: Diagnosis not present

## 2019-07-19 DIAGNOSIS — F419 Anxiety disorder, unspecified: Secondary | ICD-10-CM | POA: Diagnosis not present

## 2019-07-19 DIAGNOSIS — J302 Other seasonal allergic rhinitis: Secondary | ICD-10-CM | POA: Diagnosis not present

## 2019-07-19 DIAGNOSIS — U071 COVID-19: Secondary | ICD-10-CM | POA: Diagnosis not present

## 2019-07-20 DIAGNOSIS — F411 Generalized anxiety disorder: Secondary | ICD-10-CM | POA: Diagnosis not present

## 2019-07-27 DIAGNOSIS — F411 Generalized anxiety disorder: Secondary | ICD-10-CM | POA: Diagnosis not present

## 2019-08-01 DIAGNOSIS — R0609 Other forms of dyspnea: Secondary | ICD-10-CM | POA: Diagnosis not present

## 2019-08-03 DIAGNOSIS — F411 Generalized anxiety disorder: Secondary | ICD-10-CM | POA: Diagnosis not present

## 2019-08-08 DIAGNOSIS — F411 Generalized anxiety disorder: Secondary | ICD-10-CM | POA: Diagnosis not present

## 2019-08-14 DIAGNOSIS — R079 Chest pain, unspecified: Secondary | ICD-10-CM | POA: Diagnosis not present

## 2019-08-15 DIAGNOSIS — F411 Generalized anxiety disorder: Secondary | ICD-10-CM | POA: Diagnosis not present

## 2019-08-22 DIAGNOSIS — F411 Generalized anxiety disorder: Secondary | ICD-10-CM | POA: Diagnosis not present

## 2019-08-30 DIAGNOSIS — F411 Generalized anxiety disorder: Secondary | ICD-10-CM | POA: Diagnosis not present

## 2019-09-06 ENCOUNTER — Telehealth: Payer: Self-pay | Admitting: *Deleted

## 2019-09-06 DIAGNOSIS — F411 Generalized anxiety disorder: Secondary | ICD-10-CM | POA: Diagnosis not present

## 2019-09-06 NOTE — Telephone Encounter (Signed)
[facility] faxed a refill request for Nortrel 7/7/7 tablets.  Message sent to PCP as last Rx was given by Dr Caryl Never.

## 2019-09-07 NOTE — Telephone Encounter (Signed)
I left a detailed message with the information below at the pts cell number and asked that she call back to schedule an appt.

## 2019-09-07 NOTE — Telephone Encounter (Signed)
I have not seen her in a long time! Please schedule virtual visit so that we can check in and help with refills. Thanks!

## 2019-09-13 DIAGNOSIS — F411 Generalized anxiety disorder: Secondary | ICD-10-CM | POA: Diagnosis not present

## 2019-09-19 DIAGNOSIS — F411 Generalized anxiety disorder: Secondary | ICD-10-CM | POA: Diagnosis not present

## 2019-09-26 DIAGNOSIS — F411 Generalized anxiety disorder: Secondary | ICD-10-CM | POA: Diagnosis not present

## 2019-09-30 ENCOUNTER — Ambulatory Visit: Payer: Federal, State, Local not specified - PPO | Attending: Internal Medicine

## 2019-09-30 DIAGNOSIS — Z23 Encounter for immunization: Secondary | ICD-10-CM

## 2019-09-30 NOTE — Progress Notes (Signed)
   Covid-19 Vaccination Clinic  Name:  Brittany Shepard    MRN: [id number] DOB: [birth year]  09/30/2019  Ms. Gonnella was observed post Covid-19 immunization for 15 minutes without incident. She was provided with Vaccine Information Sheet and instruction to access the V-Safe system.   Ms. Grassi was instructed to call 911 with any severe reactions post vaccine: Marland Kitchen Difficulty breathing  . Swelling of face and throat  . A fast heartbeat  . A bad rash all over body  . Dizziness and weakness   Immunizations Administered    Name Date Dose VIS Date Route   Pfizer COVID-19 Vaccine 09/30/2019 10:03 AM 0.3 mL 06/09/2019 Intramuscular   Manufacturer: [employer], [employer]   Lot: [id number]   NDC: [address]-1

## 2019-10-11 DIAGNOSIS — F411 Generalized anxiety disorder: Secondary | ICD-10-CM | POA: Diagnosis not present

## 2019-10-24 ENCOUNTER — Ambulatory Visit: Payer: Federal, State, Local not specified - PPO

## 2019-10-24 ENCOUNTER — Ambulatory Visit: Payer: Federal, State, Local not specified - PPO | Attending: Internal Medicine

## 2019-10-24 DIAGNOSIS — Z23 Encounter for immunization: Secondary | ICD-10-CM

## 2019-10-24 NOTE — Progress Notes (Signed)
   Covid-19 Vaccination Clinic  Name:  Brittany Shepard    MRN: [id number] DOB: 1996-02-16  10/24/2019  [name] was observed post Covid-19 immunization for 15 minutes without incident. She was provided with Vaccine Information Sheet and instruction to access the V-Safe system.   [name] was instructed to call 911 with any severe reactions post vaccine: Marland Kitchen Difficulty breathing  . Swelling of face and throat  . A fast heartbeat  . A bad rash all over body  . Dizziness and weakness   Immunizations Administered    Name Date Dose VIS Date Route   Pfizer COVID-19 Vaccine 10/24/2019  8:37 AM 0.3 mL 08/23/2018 Intramuscular   Manufacturer: [employer], [employer]   Lot: [id number]   NDC: [address]-9

## 2019-10-25 DIAGNOSIS — F429 Obsessive-compulsive disorder, unspecified: Secondary | ICD-10-CM | POA: Diagnosis not present

## 2019-10-31 DIAGNOSIS — F4321 Adjustment disorder with depressed mood: Secondary | ICD-10-CM | POA: Diagnosis not present

## 2019-11-16 DIAGNOSIS — F4321 Adjustment disorder with depressed mood: Secondary | ICD-10-CM | POA: Diagnosis not present

## 2019-11-22 DIAGNOSIS — F4321 Adjustment disorder with depressed mood: Secondary | ICD-10-CM | POA: Diagnosis not present

## 2019-11-30 DIAGNOSIS — F4321 Adjustment disorder with depressed mood: Secondary | ICD-10-CM | POA: Diagnosis not present

## 2019-12-06 DIAGNOSIS — F4321 Adjustment disorder with depressed mood: Secondary | ICD-10-CM | POA: Diagnosis not present

## 2019-12-13 DIAGNOSIS — F4321 Adjustment disorder with depressed mood: Secondary | ICD-10-CM | POA: Diagnosis not present

## 2019-12-20 DIAGNOSIS — F4321 Adjustment disorder with depressed mood: Secondary | ICD-10-CM | POA: Diagnosis not present

## 2019-12-27 DIAGNOSIS — F4321 Adjustment disorder with depressed mood: Secondary | ICD-10-CM | POA: Diagnosis not present

## 2020-01-03 DIAGNOSIS — F4321 Adjustment disorder with depressed mood: Secondary | ICD-10-CM | POA: Diagnosis not present

## 2020-01-10 DIAGNOSIS — F4321 Adjustment disorder with depressed mood: Secondary | ICD-10-CM | POA: Diagnosis not present

## 2020-01-17 DIAGNOSIS — F4321 Adjustment disorder with depressed mood: Secondary | ICD-10-CM | POA: Diagnosis not present

## 2020-01-24 DIAGNOSIS — F4321 Adjustment disorder with depressed mood: Secondary | ICD-10-CM | POA: Diagnosis not present

## 2020-01-30 DIAGNOSIS — Z0000 Encounter for general adult medical examination without abnormal findings: Secondary | ICD-10-CM | POA: Diagnosis not present

## 2020-01-30 DIAGNOSIS — Z136 Encounter for screening for cardiovascular disorders: Secondary | ICD-10-CM | POA: Diagnosis not present

## 2020-01-30 DIAGNOSIS — Z1389 Encounter for screening for other disorder: Secondary | ICD-10-CM | POA: Diagnosis not present

## 2020-02-05 DIAGNOSIS — N898 Other specified noninflammatory disorders of vagina: Secondary | ICD-10-CM | POA: Diagnosis not present

## 2020-02-07 DIAGNOSIS — F4321 Adjustment disorder with depressed mood: Secondary | ICD-10-CM | POA: Diagnosis not present

## 2020-02-14 DIAGNOSIS — F4321 Adjustment disorder with depressed mood: Secondary | ICD-10-CM | POA: Diagnosis not present

## 2020-02-21 DIAGNOSIS — F4321 Adjustment disorder with depressed mood: Secondary | ICD-10-CM | POA: Diagnosis not present

## 2020-02-28 DIAGNOSIS — F4321 Adjustment disorder with depressed mood: Secondary | ICD-10-CM | POA: Diagnosis not present

## 2020-03-05 DIAGNOSIS — Z3041 Encounter for surveillance of contraceptive pills: Secondary | ICD-10-CM | POA: Diagnosis not present

## 2020-03-05 DIAGNOSIS — Z681 Body mass index (BMI) 19 or less, adult: Secondary | ICD-10-CM | POA: Diagnosis not present

## 2020-03-05 DIAGNOSIS — Z01419 Encounter for gynecological examination (general) (routine) without abnormal findings: Secondary | ICD-10-CM | POA: Diagnosis not present

## 2020-03-06 DIAGNOSIS — F4321 Adjustment disorder with depressed mood: Secondary | ICD-10-CM | POA: Diagnosis not present

## 2020-03-27 DIAGNOSIS — F4321 Adjustment disorder with depressed mood: Secondary | ICD-10-CM | POA: Diagnosis not present

## 2020-03-29 DIAGNOSIS — D1801 Hemangioma of skin and subcutaneous tissue: Secondary | ICD-10-CM | POA: Diagnosis not present

## 2020-04-02 DIAGNOSIS — Z01419 Encounter for gynecological examination (general) (routine) without abnormal findings: Secondary | ICD-10-CM | POA: Diagnosis not present

## 2020-04-03 DIAGNOSIS — F4321 Adjustment disorder with depressed mood: Secondary | ICD-10-CM | POA: Diagnosis not present

## 2020-04-10 DIAGNOSIS — F4321 Adjustment disorder with depressed mood: Secondary | ICD-10-CM | POA: Diagnosis not present

## 2020-04-18 DIAGNOSIS — F4321 Adjustment disorder with depressed mood: Secondary | ICD-10-CM | POA: Diagnosis not present

## 2020-04-24 DIAGNOSIS — F4321 Adjustment disorder with depressed mood: Secondary | ICD-10-CM | POA: Diagnosis not present

## 2020-05-01 DIAGNOSIS — Z1152 Encounter for screening for COVID-19: Secondary | ICD-10-CM | POA: Diagnosis not present

## 2020-05-01 DIAGNOSIS — Z20822 Contact with and (suspected) exposure to covid-19: Secondary | ICD-10-CM | POA: Diagnosis not present

## 2020-05-01 DIAGNOSIS — J069 Acute upper respiratory infection, unspecified: Secondary | ICD-10-CM | POA: Diagnosis not present

## 2020-05-08 DIAGNOSIS — F321 Major depressive disorder, single episode, moderate: Secondary | ICD-10-CM | POA: Diagnosis not present

## 2020-05-22 DIAGNOSIS — F321 Major depressive disorder, single episode, moderate: Secondary | ICD-10-CM | POA: Diagnosis not present

## 2020-05-29 DIAGNOSIS — F321 Major depressive disorder, single episode, moderate: Secondary | ICD-10-CM | POA: Diagnosis not present

## 2020-06-05 DIAGNOSIS — F321 Major depressive disorder, single episode, moderate: Secondary | ICD-10-CM | POA: Diagnosis not present

## 2020-06-12 DIAGNOSIS — F321 Major depressive disorder, single episode, moderate: Secondary | ICD-10-CM | POA: Diagnosis not present

## 2020-08-03 IMAGING — US ULTRASOUND LEFT BREAST LIMITED
1 series · 7 of 7 positions shown · non-contrast
Comparison: Baseline exam

CLINICAL DATA: Palpable abnormality first noted in the LOWER OUTER
QUADRANT of the LEFT breast a few weeks ago.

EXAM:
ULTRASOUND OF THE LEFT BREAST

[Series 1: ultrasound left breast limited · 0.04mm/px · 7 of 7 slices shown]
[im 1/7]
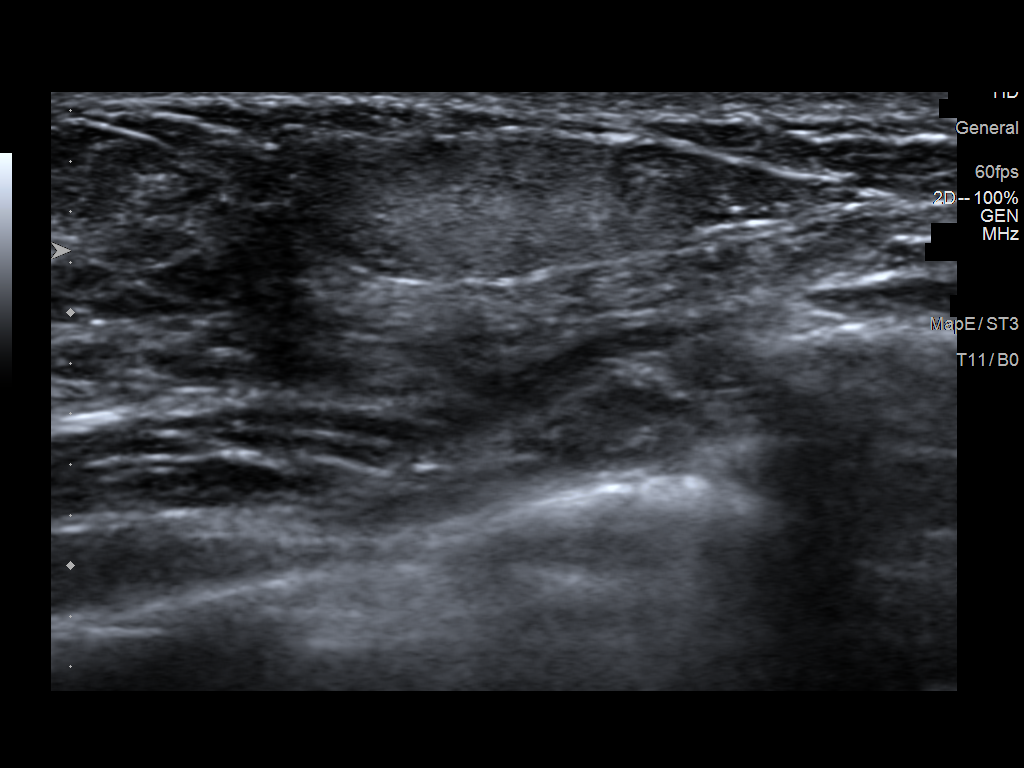
[im 2/7]
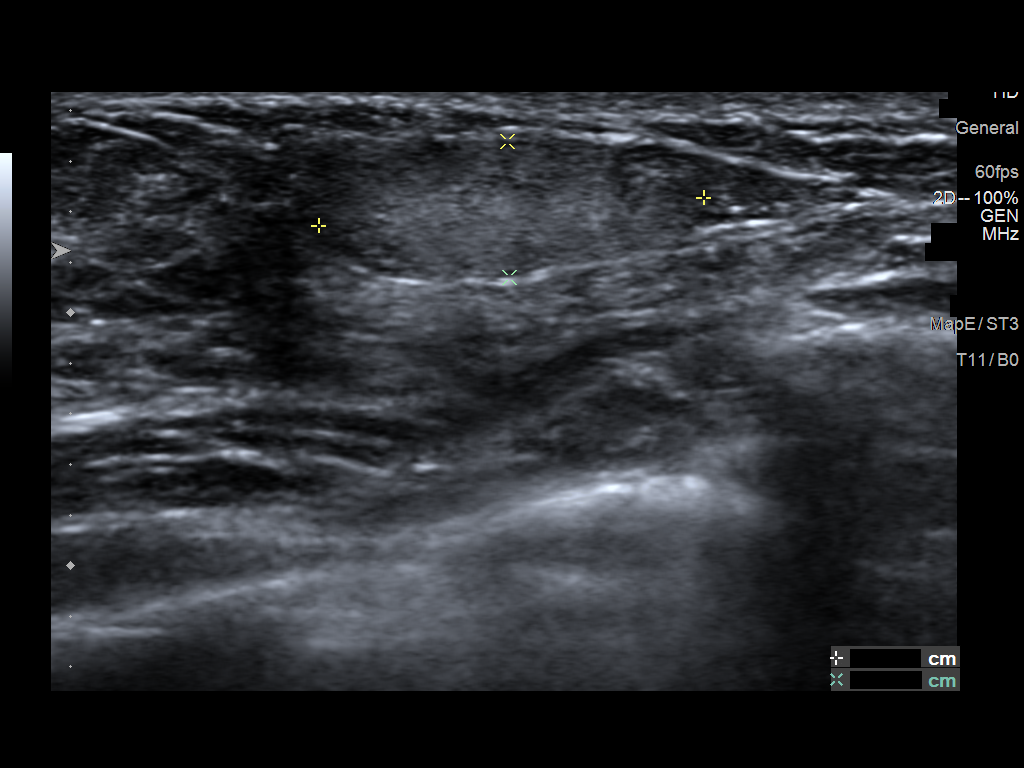
[im 3/7]
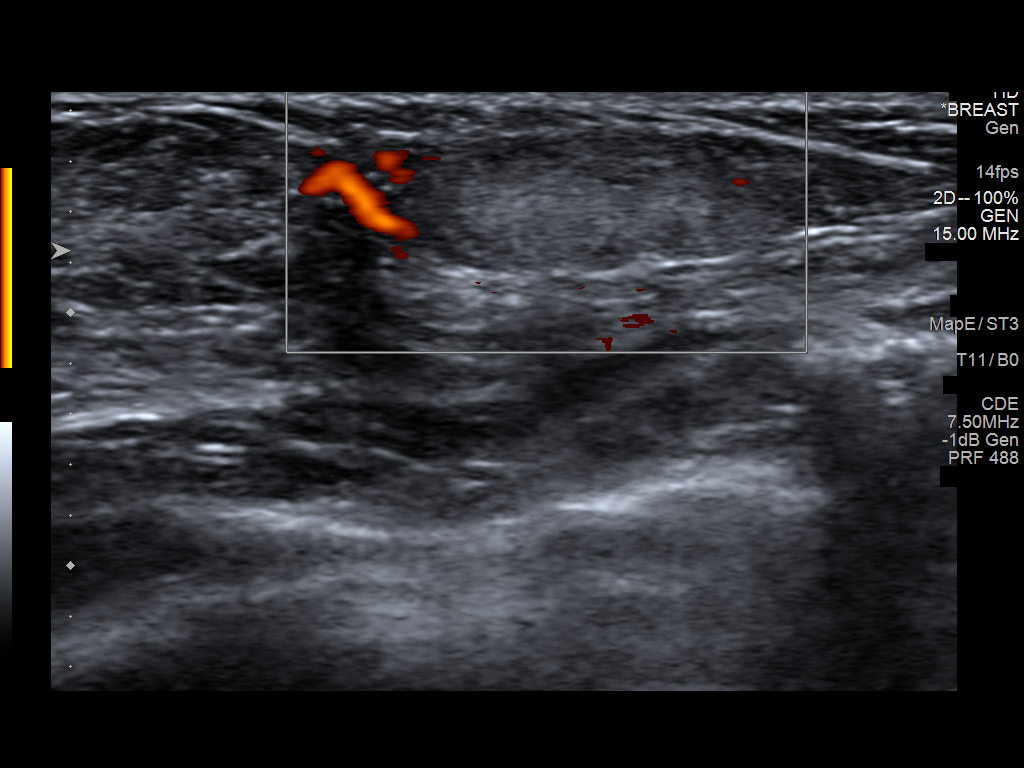
[im 4/7]
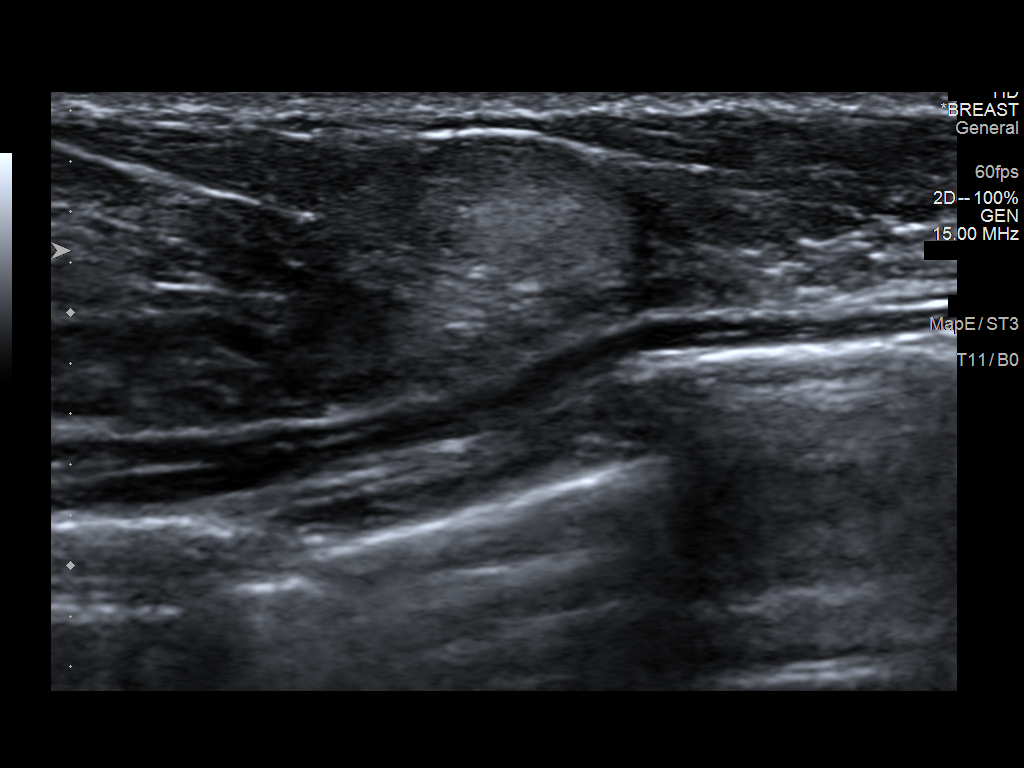
[im 5/7]
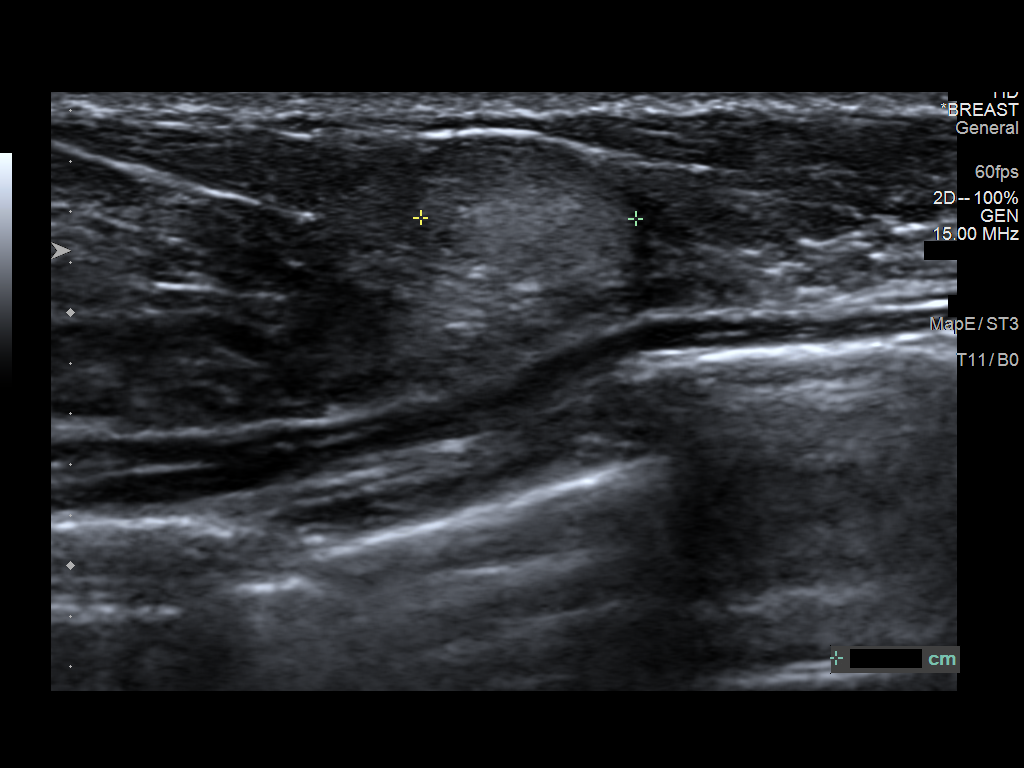
[im 6/7]
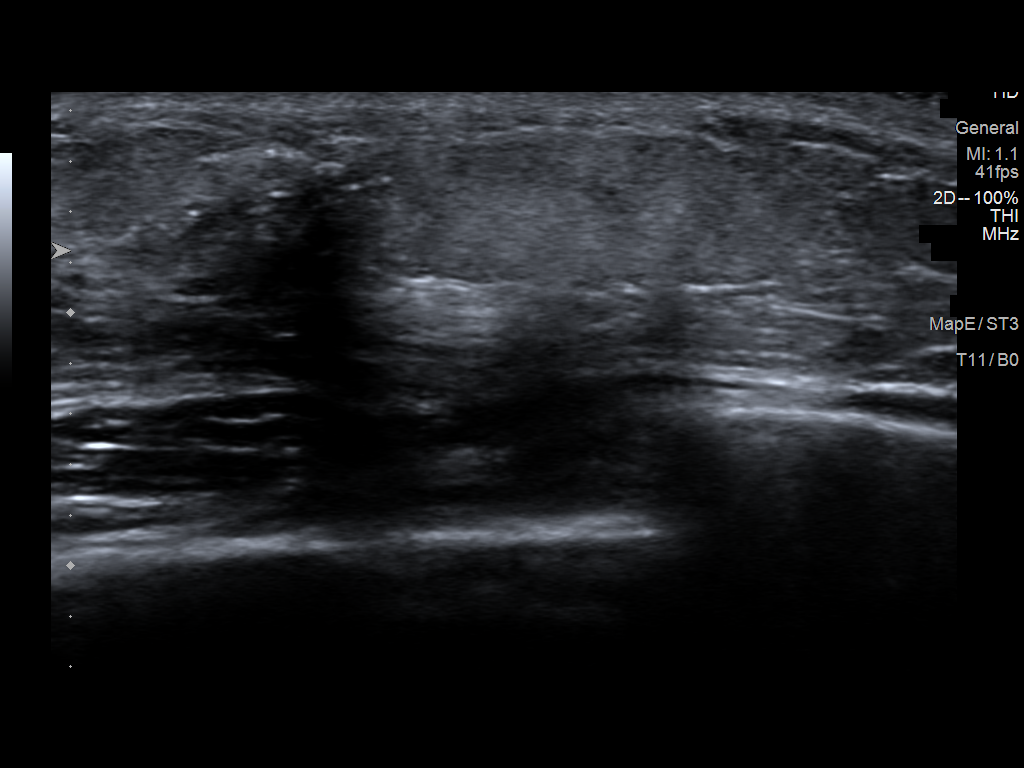
[im 7/7]
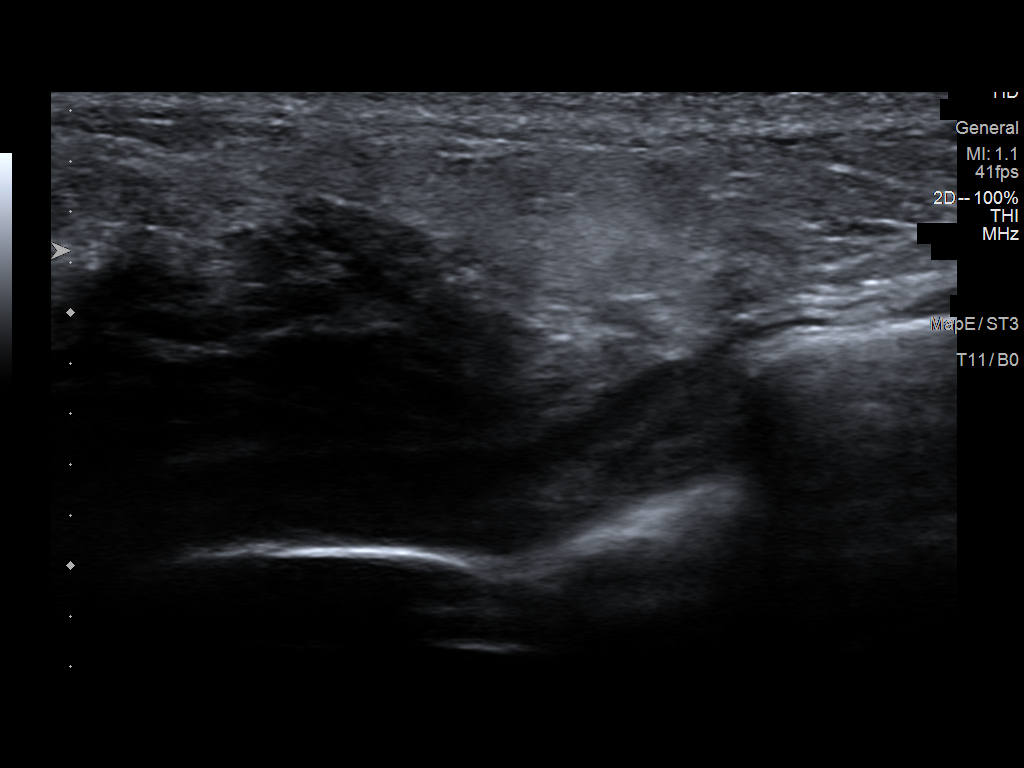

[7 of 7 positions shown; findings below may reference images not displayed]

FINDINGS: On physical exam, there is a rounded mobile mass in the LOWER OUTER
QUADRANT of the LEFT breast, best palpated when the patient sits up.

Targeted ultrasound is performed, showing a circumscribed oval
parallel hyperechoic mass in the 5 o'clock location of the LEFT
breast 4 centimeters from the nipple measuring 1.5 x 0.5 x
centimeters. There is posterior acoustic enhancement. Mass is
compressible during real-time exam.
IMPRESSION: Palpable abnormality in the LEFT breast consistent with benign
lipoma.

RECOMMENDATION:
Clinical follow-up as needed.

Screening mammogram at age 39 unless there are persistent or
intervening clinical concerns. (Code:10-2-[facility])

I have discussed the findings and recommendations with the patient
and her mother. Results were also provided in writing at the
conclusion of the visit. If applicable, a reminder letter will be
sent to the patient regarding the next appointment.

BI-RADS CATEGORY  2: Benign.
